# Patient Record
Sex: Female | Born: 1955 | Race: White | Hispanic: No | Marital: Married | Smoking: Never smoker
Health system: Southern US, Community
[De-identification: ages and names within clinical notes are randomized; demographics above are authoritative.]

## PROBLEM LIST (undated history)

## (undated) DIAGNOSIS — F419 Anxiety disorder, unspecified: Secondary | ICD-10-CM

## (undated) DIAGNOSIS — K219 Gastro-esophageal reflux disease without esophagitis: Secondary | ICD-10-CM

## (undated) DIAGNOSIS — J189 Pneumonia, unspecified organism: Secondary | ICD-10-CM

## (undated) HISTORY — DX: Anxiety disorder, unspecified: F41.9

## (undated) HISTORY — DX: Pneumonia, unspecified organism: J18.9

## (undated) HISTORY — DX: Gastro-esophageal reflux disease without esophagitis: K21.9

---

## 1989-11-22 HISTORY — PX: BREAST BIOPSY: SHX20

## 1999-09-23 ENCOUNTER — Other Ambulatory Visit: Admission: RE | Admit: 1999-09-23 | Discharge: 1999-09-23 | Payer: Self-pay | Admitting: Urology

## 2001-08-02 ENCOUNTER — Other Ambulatory Visit: Admission: RE | Admit: 2001-08-02 | Discharge: 2001-08-02 | Payer: Self-pay | Admitting: Obstetrics & Gynecology

## 2003-01-01 ENCOUNTER — Encounter: Payer: Self-pay | Admitting: Family Medicine

## 2003-01-01 ENCOUNTER — Encounter: Admission: RE | Admit: 2003-01-01 | Discharge: 2003-01-01 | Payer: Self-pay | Admitting: Family Medicine

## 2003-07-18 ENCOUNTER — Encounter: Payer: Self-pay | Admitting: Internal Medicine

## 2003-11-14 ENCOUNTER — Encounter: Admission: RE | Admit: 2003-11-14 | Discharge: 2003-11-14 | Payer: Self-pay | Admitting: Family Medicine

## 2004-01-31 ENCOUNTER — Other Ambulatory Visit: Admission: RE | Admit: 2004-01-31 | Discharge: 2004-01-31 | Payer: Self-pay | Admitting: Obstetrics & Gynecology

## 2004-12-08 ENCOUNTER — Encounter: Admission: RE | Admit: 2004-12-08 | Discharge: 2004-12-08 | Payer: Self-pay | Admitting: Family Medicine

## 2004-12-26 ENCOUNTER — Encounter: Admission: RE | Admit: 2004-12-26 | Discharge: 2004-12-26 | Payer: Self-pay | Admitting: Neurology

## 2005-02-08 ENCOUNTER — Other Ambulatory Visit: Admission: RE | Admit: 2005-02-08 | Discharge: 2005-02-08 | Payer: Self-pay | Admitting: Obstetrics & Gynecology

## 2005-02-16 ENCOUNTER — Ambulatory Visit (HOSPITAL_COMMUNITY): Admission: RE | Admit: 2005-02-16 | Discharge: 2005-02-17 | Payer: Self-pay | Admitting: Neurology

## 2005-04-30 ENCOUNTER — Encounter (INDEPENDENT_AMBULATORY_CARE_PROVIDER_SITE_OTHER): Payer: Self-pay | Admitting: *Deleted

## 2005-04-30 ENCOUNTER — Encounter: Admission: RE | Admit: 2005-04-30 | Discharge: 2005-04-30 | Payer: Self-pay | Admitting: Family Medicine

## 2005-08-03 ENCOUNTER — Ambulatory Visit: Payer: Self-pay | Admitting: Internal Medicine

## 2005-08-17 ENCOUNTER — Encounter: Payer: Self-pay | Admitting: Internal Medicine

## 2005-08-17 ENCOUNTER — Ambulatory Visit (HOSPITAL_COMMUNITY): Admission: RE | Admit: 2005-08-17 | Discharge: 2005-08-17 | Payer: Self-pay | Admitting: Internal Medicine

## 2006-01-23 ENCOUNTER — Encounter: Admission: RE | Admit: 2006-01-23 | Discharge: 2006-01-23 | Payer: Self-pay | Admitting: Family Medicine

## 2006-01-28 ENCOUNTER — Encounter: Admission: RE | Admit: 2006-01-28 | Discharge: 2006-01-28 | Payer: Self-pay | Admitting: Family Medicine

## 2009-02-10 ENCOUNTER — Encounter: Admission: RE | Admit: 2009-02-10 | Discharge: 2009-02-10 | Payer: Self-pay | Admitting: Family Medicine

## 2009-02-19 ENCOUNTER — Encounter: Admission: RE | Admit: 2009-02-19 | Discharge: 2009-02-19 | Payer: Self-pay

## 2009-02-28 ENCOUNTER — Ambulatory Visit: Payer: Self-pay | Admitting: Cardiology

## 2009-02-28 ENCOUNTER — Encounter: Payer: Self-pay | Admitting: Cardiology

## 2009-02-28 DIAGNOSIS — R002 Palpitations: Secondary | ICD-10-CM | POA: Insufficient documentation

## 2009-02-28 DIAGNOSIS — R079 Chest pain, unspecified: Secondary | ICD-10-CM

## 2009-03-04 ENCOUNTER — Ambulatory Visit: Payer: Self-pay | Admitting: Cardiology

## 2009-03-05 ENCOUNTER — Ambulatory Visit: Payer: Self-pay

## 2009-03-05 ENCOUNTER — Encounter: Payer: Self-pay | Admitting: Cardiology

## 2009-03-07 ENCOUNTER — Telehealth: Payer: Self-pay | Admitting: Cardiology

## 2009-03-11 ENCOUNTER — Telehealth: Payer: Self-pay | Admitting: Cardiology

## 2009-03-13 ENCOUNTER — Encounter: Payer: Self-pay | Admitting: Internal Medicine

## 2009-03-14 ENCOUNTER — Telehealth: Payer: Self-pay | Admitting: Cardiology

## 2009-03-18 ENCOUNTER — Telehealth: Payer: Self-pay | Admitting: Cardiology

## 2009-04-28 ENCOUNTER — Encounter: Payer: Self-pay | Admitting: Internal Medicine

## 2009-04-30 ENCOUNTER — Encounter: Payer: Self-pay | Admitting: Internal Medicine

## 2009-05-05 ENCOUNTER — Telehealth: Payer: Self-pay | Admitting: Internal Medicine

## 2009-05-06 DIAGNOSIS — R1319 Other dysphagia: Secondary | ICD-10-CM

## 2009-05-06 DIAGNOSIS — K219 Gastro-esophageal reflux disease without esophagitis: Secondary | ICD-10-CM

## 2009-05-09 ENCOUNTER — Ambulatory Visit: Payer: Self-pay | Admitting: Internal Medicine

## 2009-05-09 DIAGNOSIS — R030 Elevated blood-pressure reading, without diagnosis of hypertension: Secondary | ICD-10-CM

## 2009-05-09 DIAGNOSIS — F411 Generalized anxiety disorder: Secondary | ICD-10-CM | POA: Insufficient documentation

## 2009-05-09 DIAGNOSIS — E559 Vitamin D deficiency, unspecified: Secondary | ICD-10-CM | POA: Insufficient documentation

## 2009-05-12 ENCOUNTER — Telehealth: Payer: Self-pay | Admitting: Internal Medicine

## 2009-05-12 DIAGNOSIS — Z8719 Personal history of other diseases of the digestive system: Secondary | ICD-10-CM

## 2009-05-12 DIAGNOSIS — G43909 Migraine, unspecified, not intractable, without status migrainosus: Secondary | ICD-10-CM | POA: Insufficient documentation

## 2009-05-13 ENCOUNTER — Ambulatory Visit: Payer: Self-pay | Admitting: Internal Medicine

## 2009-05-13 ENCOUNTER — Telehealth: Payer: Self-pay | Admitting: Cardiology

## 2009-05-14 ENCOUNTER — Telehealth: Payer: Self-pay | Admitting: Internal Medicine

## 2009-05-15 ENCOUNTER — Telehealth: Payer: Self-pay | Admitting: Internal Medicine

## 2009-05-16 ENCOUNTER — Ambulatory Visit: Payer: Self-pay | Admitting: Internal Medicine

## 2009-05-16 ENCOUNTER — Telehealth: Payer: Self-pay | Admitting: Internal Medicine

## 2009-06-02 ENCOUNTER — Telehealth: Payer: Self-pay | Admitting: Internal Medicine

## 2009-06-10 ENCOUNTER — Ambulatory Visit: Payer: Self-pay | Admitting: Internal Medicine

## 2009-07-03 ENCOUNTER — Telehealth (INDEPENDENT_AMBULATORY_CARE_PROVIDER_SITE_OTHER): Payer: Self-pay | Admitting: *Deleted

## 2009-07-03 ENCOUNTER — Ambulatory Visit: Payer: Self-pay | Admitting: Internal Medicine

## 2009-07-07 ENCOUNTER — Telehealth (INDEPENDENT_AMBULATORY_CARE_PROVIDER_SITE_OTHER): Payer: Self-pay | Admitting: *Deleted

## 2009-07-07 ENCOUNTER — Telehealth: Payer: Self-pay | Admitting: Cardiology

## 2009-07-16 ENCOUNTER — Telehealth: Payer: Self-pay | Admitting: Internal Medicine

## 2010-02-23 ENCOUNTER — Telehealth: Payer: Self-pay | Admitting: Internal Medicine

## 2010-04-06 ENCOUNTER — Ambulatory Visit: Payer: Self-pay | Admitting: Internal Medicine

## 2010-12-13 ENCOUNTER — Encounter: Payer: Self-pay | Admitting: Internal Medicine

## 2010-12-13 ENCOUNTER — Encounter: Payer: Self-pay | Admitting: Family Medicine

## 2010-12-22 NOTE — Assessment & Plan Note (Signed)
Summary: STINGING/BURNING IN ESOPHAGUS             Veronica Sutton   History of Present Illness Visit Type: Follow-up Visit Primary GI MD: Veronica Sar MD Primary Provider: na Requesting Provider: n/a Chief Complaint: Pt was having burning and stinging in esphagus but she started using carafate and that has helped with the symptoms   History of Present Illness:   This is a 54 year old white female substernal and epigastric abdominal burning, history of anxiety and irritable bowel syndrome. An upper endoscopy and colonoscopy in July 2010 was a normal exam. In 2004, her upper endoscopy was also  normal and her CLOtest was negative. She has had a normal gastric emptying scan in 2006 and normal abdominal ultrasound in June 2006. There is a family history of colon cancer in a parent. She was taking Prevacid 30 mg daily for gastroesophageal reflux but was concerned about  the possibility of osteoporosis. When she discontinued Prevacid, she developed a severe rebound of the acid which lasted  several days. She is currently on Ranitidine 75 mg once or twice a day. Her burning is substernal and to the left of the sternum and it occurs when she sits at the computer, usually after meals. It has been completely relieved by Carafate slurry 10 cc twice a day.   GI Review of Systems      Denies abdominal pain, acid reflux, belching, bloating, chest pain, dysphagia with liquids, dysphagia with solids, heartburn, loss of appetite, nausea, vomiting, vomiting blood, weight loss, and  weight gain.        Denies anal fissure, black tarry stools, change in bowel habit, constipation, diarrhea, diverticulosis, fecal incontinence, heme positive stool, hemorrhoids, irritable bowel syndrome, jaundice, light color stool, liver problems, rectal bleeding, and  rectal pain.    Current Medications (verified): 1)  Calcium/magnesium/vit D3-Liquid .... Two Times A Day 2)  Carafate 1 Gm/69ml  Susp (Sucralfate) .... Take 2 Tsp. By Mouth 4  Times Daily For 48 Hours, Then 2 Times Daily.  Take 1 Hour Before or 1 Hour After Other Meds and Foods. 3)  Fish Oil   Oil (Fish Oil) .... One Tablet By Mouth Once Daily 4)  Vitamin E 600 Unit  Caps (Vitamin E) .... One Capsule By Mouth Once Daily 5)  Vitamin D 1000 Unit  Tabs (Cholecalciferol) .... One Tablet By Mouth Once Daily 6)  Zantac 75 75 Mg Tabs (Ranitidine Hcl) .... One Tablet By Mouth Two Times A Day As Needed  Allergies (verified): 1)  ! Polysporin (Bacitracin-Polymyxin B)  Past History:  Past Medical History: Reviewed history from 05/12/2009 and no changes required. GERD with esophagitis 2004, S/P dilation Vit D Defficiency 2010 (values :18 initially , high of 36 after treatment), Dr Jennette Kettle   ESOPHAGITIS, HX OF (ICD-V12.79) Family Hx of COLON CANCER (ICD-153.9) MIGRAINE HEADACHE (ICD-346.90) ANXIETY STATE, UNSPECIFIED (ICD-300.00) VITAMIN D DEFICIENCY (ICD-268.9) ELEVATED BLOOD PRESSURE WITHOUT DIAGNOSIS OF HYPERTENSION (ICD-796.2) GERD (ICD-530.81) DYSPHAGIA (ZOX-096.04) PALPITATIONS (ICD-785.1) CHEST PAIN (ICD-786.50)    Past Surgical History: Reviewed history from 05/12/2009 and no changes required. Toenail Bed biopsy-x2 Breast Bx-Right : negative 1991;  Endo 2004 with dilation  Family History: Reviewed history from 07/03/2009 and no changes required. Brother with possible silent MI in his 69s;  F Idiopathic Pulmonary Fibrosis (she believes diagnosis was delayed) No other early heart disease. Family History of Colon Cancer: Mother? Family History of Colon Polyps: Father Family History of Heart Disease: PGF, Brother? Family History of Diabetes: MGF. M had ?  schizophrenia; bro depression; F anxiety  Social History: Reviewed history from 02/28/2009 and no changes required. Married  Tobacco Use - No.  Alcohol Use - no Regular Exercise - no Children - 2  Review of Systems  The patient denies allergy/sinus, anemia, anxiety-new, arthritis/joint pain, back  pain, blood in urine, breast changes/lumps, change in vision, confusion, cough, coughing up blood, depression-new, fainting, fatigue, fever, headaches-new, hearing problems, heart murmur, heart rhythm changes, itching, menstrual pain, muscle pains/cramps, night sweats, nosebleeds, pregnancy symptoms, shortness of breath, skin rash, sleeping problems, sore throat, swelling of feet/legs, swollen lymph glands, thirst - excessive , urination - excessive , urination changes/pain, urine leakage, vision changes, and voice change.         Pertinent positive and negative review of systems were noted in the above HPI. All other ROS was otherwise negative.   Vital Signs:  Patient profile:   55 year old female Height:      64.25 inches Weight:      104 pounds BMI:     17.78 BSA:     1.49 Pulse rate:   64 / minute Pulse rhythm:   regular BP sitting:   126 / 78  (left arm) Cuff size:   regular  Vitals Entered By: Ok Anis CMA (Apr 06, 2010 10:23 AM)   Impression & Recommendations:  Problem # 1:  ESOPHAGITIS, HX OF (ICD-V12.79) Patient has gastroesophageal reflux controlled on PPIs but patient prefers not to take PPIs due to the possibility of osteoporosis. She needs to increase her ranitidine to 150 mg once or twice a day to control the reflux. She is hoping to eventually be able to stop the acid reducers altogether. We discussed Carafate slurry 10 cc p.o. b.i.d. She may increase it to q.i.d. p.r.n.  Problem # 2:  Family Hx of COLON CANCER (ICD-153.9) Patient is due to date on her colonoscopy. She was last examined in July 2010. Her next exam will be due in July 2015.  Problem # 3:  ANXIETY STATE, UNSPECIFIED (ICD-300.00) chronic intermittent anxiety. Taking Xanax 0.25 mg p.r.n.  Patient Instructions: 1)  Carafate slurry 10 cc p.o. b.i.d. or p.r.n. 2)  Zantac 150 mg p.o. b.i.d. x2 weeks then q.d. 3)  Antireflux measures. 4)  Recall colonoscopy July 2015. 5)  Patient would like to get a new  primary care physician. We will have her contact Dr.MaryJohn Baxley. 6)  Copy sent to : Dr Foye Deer Baxley 7)  The medication list was reviewed and reconciled.  All changed / newly prescribed medications were explained.  A complete medication list was provided to the patient / caregiver. Prescriptions: ZANTAC 75 75 MG TABS (RANITIDINE HCL) one tablet by mouth two times a day as needed  #60 x 3   Entered by:   Lamona Curl CMA (AAMA)   Authorized by:   Hart Carwin MD   Signed by:   Lamona Curl CMA (AAMA) on 04/06/2010   Method used:   Electronically to        [facility]* (retail)       [address]       [place], [place]  [id number]       Ph: [phone]       Fax: [phone]   RxID:   [id number] CARAFATE 1 GM/10ML  SUSP (SUCRALFATE) Take 2 tsp. by mouth 4 times daily for 48 hours, then 2 times daily.  Take 1 hour before or 1 hour after other meds and foods.  #12  ounces x 1   Entered by:   Lamona Curl CMA (AAMA)   Authorized by:   Hart Carwin MD   Signed by:   Lamona Curl CMA (AAMA) on 04/06/2010   Method used:   Electronically to        [facility]* (retail)       [address]       [place], [place]  [id number]       Ph: [phone]       Fax: [phone]   RxID:   [id number]

## 2010-12-22 NOTE — Progress Notes (Signed)
Summary: Triage  Phone Note Call from Patient Call back at Home Phone [phone]   Caller: Patient Call For: Dr. Juanda Chance Reason for Call: Talk to Nurse Summary of Call: problems w/her esophagus. Feels like she may have a ulcer Initial call taken by: Karna Christmas,  February 23, 2010 9:08 AM  Follow-up for Phone Call        Pt. c/o stinging/burning sensation in her esophagus, has episodes of this sensation. Uses Prevacid and Zantac PRN, but thinks Prevacid is causing her reflux symptoms to get worse. Denies n/v, bloating, constipation, diarrhea, blood, black stools, fever. Declines an appt. with an extender, only wants to see Dr.Anamika Kueker.  1) See Dr.Darianna Amy on 03-18-10 at 9am 2) Soft,bland diet. No spicy,greasy,fried foods. Advance diet as tolerated. 3) Prilosec OTC or Zegerid OTC daily until OV--BUT,Pt. declines to use meds. 4) Pt. instructed to call back as needed.  *DR.Marisha Renier--Pt.requests you call her today, she insists she speak with you ASAP. [phone].   Follow-up by: Laureen Ochs LPN,  February 23, 2010 9:29 AM  Additional Follow-up for Phone Call Additional follow up Details #1::        I have spoken to the pt., please send Carafate slurry ,#12 oz, 2 tsp by mouth qid x 48 hrs then two times a day.[employer], 1 refill,  She will call back with an update in 2 weeks. please also , find a result of gastric biopsy /CLO test form 2004 EGD ( not scanned in EMR)Thanx Additional Follow-up by: Hart Carwin MD,  February 23, 2010 1:12 PM    Additional Follow-up for Phone Call Additional follow up Details #2::    Script to pt. pharmacy. I have requested pt. chart to get the BX. reports. Follow-up by: Laureen Ochs LPN,  February 23, 2010 2:41 PM  Additional Follow-up for Phone Call Additional follow up Details #3:: Details for Additional Follow-up Action Taken: DR.Monaye Blackie--The pt. chart and biopsy reports are on your desk for review. (reports have been sent to be scanned into  EMR) Laureen Ochs LPN  February 24, 4781 11:55 AM 2004 EGD CLO was negative. Quanisha Drewry  Additional Follow-up by: Hart Carwin MD,  February 24, 2010 10:36 PM  New/Updated Medications: CARAFATE 1 GM/10ML  SUSP (SUCRALFATE) Take 2 tsp. by mouth 4 times daily for 48 hours, then 2 times daily.  Take 1 hour before or 1 hour after other meds and foods. Prescriptions: CARAFATE 1 GM/10ML  SUSP (SUCRALFATE) Take 2 tsp. by mouth 4 times daily for 48 hours, then 2 times daily.  Take 1 hour before or 1 hour after other meds and foods.  #12 oz. x 1   Entered by:   Laureen Ochs LPN   Authorized by:   Hart Carwin MD   Signed by:   Laureen Ochs LPN on 95/62/1308   Method used:   Electronically to        [facility]* (retail)       [address]       [place], [place]  [id number]       Ph: [phone]       Fax: [phone]   RxID:   [id number]

## 2011-04-09 NOTE — Op Note (Signed)
Veronica Sutton, Veronica Sutton                ACCOUNT NO.:  [id number]   MEDICAL RECORD NO.:  [id number]          PATIENT TYPE:  OUT   LOCATION:  [facility]                          FACILITY:  [facility]   PHYSICIAN:  Genene Churn. Love, M.D.    DATE OF BIRTH:  18-Mar-1956   DATE OF PROCEDURE:  02/16/2005  DATE OF DISCHARGE:                                 OPERATIVE REPORT   DESCRIPTION OF PROCEDURE:  The patient was prepped and draped in the left  lateral decubitus position using Betadine and 1% Xylocaine.  The L3-4  interspace was entered without difficulty.  Opening pressure was slightly  high at 200 mmH2O, but the bed was not flat.  Clear, colorless CSF was  obtained and sent for protein, glucose, VDRL, cell count, and differential,  Lyme titer, IgG, oligoclonal IgG, and angiotensin converting enzyme, and  anti-DNA.  The patient tolerated the procedure well.  Tube was to be held.      JML/MEDQ  D:  02/16/2005  T:  02/16/2005  Job:  [id number]

## 2011-07-05 ENCOUNTER — Ambulatory Visit (INDEPENDENT_AMBULATORY_CARE_PROVIDER_SITE_OTHER): Payer: BC Managed Care – PPO | Admitting: Internal Medicine

## 2011-07-05 ENCOUNTER — Encounter: Payer: Self-pay | Admitting: Internal Medicine

## 2011-07-05 VITALS — BP 110/74 | HR 76 | Ht 64.0 in | Wt 113.0 lb

## 2011-07-05 DIAGNOSIS — K224 Dyskinesia of esophagus: Secondary | ICD-10-CM

## 2011-07-05 DIAGNOSIS — R079 Chest pain, unspecified: Secondary | ICD-10-CM

## 2011-07-05 DIAGNOSIS — K219 Gastro-esophageal reflux disease without esophagitis: Secondary | ICD-10-CM

## 2011-07-05 DIAGNOSIS — K589 Irritable bowel syndrome without diarrhea: Secondary | ICD-10-CM

## 2011-07-05 NOTE — Patient Instructions (Addendum)
You have been scheduled for a Barium Esophogram and Upper GI at [facility] (1st floor of the hospital) on 07/08/11 at 10:30 am. Please arrive 15 minutes prior to your appointment for registration. Make certain not to have anything to eat or drink 4 hours prior to your test. If you need to reschedule for any reason, please contact radiology at [phone] to do so.

## 2011-07-05 NOTE — Progress Notes (Signed)
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 She has had at least 2 upper endoscopies; the last one in the June 2010 which were essentially normal. The gastric emptying scan in 2006 was normal. She has postprandial chest pain which occurs 30-45 minutes after meals while sitting at the table. She may prevent the pain by standing up and walking after the meal. The pain and burning is associated only with meals. It does not occur when she exercises or when she is in a supine position at night. There was no pain when she was on Prevacid 30 mg a day but she discontinued it because of concern for osteoporosis. She was subsequently taking Zantac 150 mg a day but also discontinued that. She is currently not taking any acid suppressing agents.   Past Medical History  Diagnosis Date  . GERD (gastroesophageal reflux disease)   . Esophagitis   . Vitamin D deficiency   . Migraine   . Anxiety    Past Surgical History  Procedure Date  . Breast biopsy 1991    right    reports that she has never smoked. She has never used smokeless tobacco. She reports that she does not drink alcohol or use illicit drugs. family history includes Anxiety disorder in her father; Colon cancer in her mother; Colon polyps in her father; Depression in her brother; Diabetes in her maternal grandfather; Heart attack in her brother; Heart disease in her paternal grandfather; Pulmonary fibrosis in her father; and Schizophrenia in her mother. Allergies  Allergen Reactions  . Bacitracin-Polymyxin B     Rash after using neosporin        Review of Systems: Denies any lower GI symptoms of abdominal pain or constipation.  The remainder of the 10  point ROS is negative except as outlined in H&P   Physical Exam: General appearance  Well  developed in no distress. Eyes- non icteric. HEENT nontraumatic, normocephalic. Mouth no lesions, tongue papillated, no cheilosis. Neck supple without adenopathy, thyroid not enlarged, no carotid bruits, no JVD. Lungs Clear to auscultation bilaterally. Cor normal S1 normal S2, regular rhythm , no murmur,  quiet precordium. Abdomen nontender. Rectal: Not done. Extremities no pedal edema. Skin no lesions. Neurological alert and oriented x 3. Psychological normal mood and affect.  Assessment and Plan:  Problem #2 Gastroesophageal reflux versus esophageal spasm. Her symptoms recurred after she discontinued acid suppressing agents. We will proceed with a barium esophagram and upper GI series to assess for motility, reflux and her gastric emptying. She will likely have to start back on acid suppressing agents. She wants to wait on the results of the upper GI series before starting this due to concern about osteoporosis.   Problem #1 Family history of colon cancer. Her last colonoscopy was in 2010 and her next colonoscopy will be due in 2015.  07/05/2011 Lina Sar

## 2011-07-06 ENCOUNTER — Encounter: Payer: Self-pay | Admitting: Internal Medicine

## 2011-07-08 ENCOUNTER — Ambulatory Visit (HOSPITAL_COMMUNITY)
Admission: RE | Admit: 2011-07-08 | Discharge: 2011-07-08 | Disposition: A | Discharge status: Home / Self Care | Payer: BC Managed Care – PPO | Source: Ambulatory Visit | Attending: Internal Medicine | Admitting: Internal Medicine

## 2011-07-08 DIAGNOSIS — R079 Chest pain, unspecified: Secondary | ICD-10-CM | POA: Insufficient documentation

## 2011-07-08 DIAGNOSIS — K224 Dyskinesia of esophagus: Secondary | ICD-10-CM

## 2011-07-09 ENCOUNTER — Telehealth: Payer: Self-pay | Admitting: *Deleted

## 2011-07-09 NOTE — Telephone Encounter (Signed)
Patient given results and recommendations. Patient states she had a hard time with side effects of Prevacid(bone density loss, wt. Loss) and she thought Dr. Juanda Chance talked about her going on Zantac after the procedure. Patient wants to clarify which she should do. Please, advise.

## 2011-07-09 NOTE — Telephone Encounter (Signed)
Message copied by Daphine Deutscher on Fri Jul 09, 2011  9:15 AM ------      Message from: Hart Carwin      Created: Thu Jul 08, 2011  9:38 PM       Please call pt with normal Ba esophagram: normal esophageal peristalsis, no spasm or abnormal contractions. They have not noticed any g-e reflux. Tablet passed without delay. I suggest that she gets back on Prevacid 30 mg/day x 1 month and calls me back after 4 weeks.

## 2011-07-09 NOTE — Telephone Encounter (Signed)
She ought to be on a PPI because it is stronger than H2RA ( Zantac), and we need to know if her Sx' s are related to reflux. Zantac is not strong enough and we may be left in doubt at the end of the month as to what is causing her chest pain.

## 2011-07-09 NOTE — Telephone Encounter (Signed)
Patient given Dr. Brodie's recommendations. 

## 2013-07-27 ENCOUNTER — Other Ambulatory Visit: Payer: Self-pay | Admitting: Obstetrics & Gynecology

## 2014-02-13 ENCOUNTER — Encounter: Payer: Self-pay | Admitting: Internal Medicine

## 2014-03-27 ENCOUNTER — Emergency Department (HOSPITAL_COMMUNITY)
Admission: EM | Admit: 2014-03-27 | Discharge: 2014-03-27 | Disposition: A | Discharge status: Home / Self Care | Payer: BC Managed Care – PPO | Source: Home / Self Care | Attending: Emergency Medicine | Admitting: Emergency Medicine

## 2014-03-27 ENCOUNTER — Encounter (HOSPITAL_COMMUNITY): Payer: Self-pay | Admitting: Emergency Medicine

## 2014-03-27 DIAGNOSIS — W57XXXA Bitten or stung by nonvenomous insect and other nonvenomous arthropods, initial encounter: Secondary | ICD-10-CM

## 2014-03-27 DIAGNOSIS — T148 Other injury of unspecified body region: Secondary | ICD-10-CM

## 2014-03-27 MED ORDER — DOXYCYCLINE HYCLATE 100 MG PO TABS: 100.0000 mg | ORAL_TABLET | Freq: Two times a day (BID) | ORAL | Status: DC

## 2014-03-27 NOTE — ED Provider Notes (Signed)
  Chief Complaint    Chief Complaint  Patient presents with  . Tick Removal    History of Present Illness      Veronica Sutton is a 58 year old female who discovered a tick attached to her left, lateral ankle last night. She was afraid to remove it herself. It's not been painful. There is no surrounding erythema she denies any systemic symptoms such as fever, chills, headache, muscle aches, joint aches, or generalized rash.  Review of Systems   Other than as noted above, the patient denies any of the following symptoms: Systemic:  No fever or chills. ENT:  No nasal congestion, rhinorrhea, sore throat, swelling of lips, tongue or throat. Resp:  No cough, wheezing, or shortness of breath.  [place]    Past medical history, family history, social history, meds, and allergies were reviewed.   Physical Exam     Vital signs:  BP 136/64  Pulse 60  Temp(Src) 97.9 F (36.6 C) (Oral)  Resp 14  SpO2 100% Gen:  Alert, oriented, in no distress. ENT:  Pharynx clear, no intraoral lesions, moist mucous membranes. Lungs:  Clear to auscultation. Skin:  Skin was clear and free of any rash. There is a tiny tick attached to the lateral aspect of the left ankle.  Procedure Note:  Verbal informed consent was obtained from the patient.  Risks and benefits were outlined with the patient.  Patient understands and accepts these risks. A time out was called and the procedure and identity of the patient were confirmed verbally.    The procedure was then performed as follows:  The tick was prepped with Betadine and alcohol, it was then grasped with a forceps and removed with steady pressure. There were not any apparent mouthparts left behind.  The patient tolerated the procedure well without any immediate complications.   Assessment    The encounter diagnosis was Tick bite.  No evidence of [place] spotted fever Lyme disease at this point. The patient was provided with a prescription for doxycycline.  She'll hold onto it for right now. If she has any localized redness or swelling, she go ahead and start the doxycycline. If she develops any systemic symptoms such as fever, chills, headache, muscle aches, joint pain, or skin rash, she is to come back here immediately.  Plan     1.  Meds:  The following meds were prescribed:   Discharge Medication List as of 03/27/2014  2:06 PM    START taking these medications   Details  doxycycline (VIBRA-TABS) 100 MG tablet Take 1 tablet (100 mg total) by mouth 2 (two) times daily., Starting 03/27/2014, Until Discontinued, Print        2.  Patient Education/Counseling:  The patient was given appropriate handouts, self care instructions, and instructed in symptomatic relief.  Suggested she wash the area with soap and water and apply antibiotic for the next 3 days.  3.  Follow up:  The patient was told to follow up here if no better in 3 to 4 days, or sooner if becoming worse in any way, and given some red flag symptoms such as worsening rash, fever, or difficulty breathing which would prompt immediate return.  Follow up here if necessary.      Harden Mo, MD 03/27/14 [phone]

## 2014-03-27 NOTE — ED Notes (Signed)
Found tick attached to left ankle this AM. NAD

## 2014-03-27 NOTE — Discharge Instructions (Signed)
Wash with soap and water and apply antibiotic ointment twice daily for next 3 days.  If local redness occurs, start Doxycycline.  If generalized symptoms of fever, chills, aches, headache or skin rash occur, come in for recheck.

## 2014-09-04 ENCOUNTER — Encounter: Payer: Self-pay | Admitting: Internal Medicine

## 2014-09-19 ENCOUNTER — Other Ambulatory Visit: Payer: Self-pay | Admitting: Obstetrics & Gynecology

## 2014-09-20 LAB — CYTOLOGY - PAP

## 2015-03-12 ENCOUNTER — Encounter: Payer: Self-pay | Admitting: Internal Medicine

## 2017-01-10 ENCOUNTER — Other Ambulatory Visit: Payer: Self-pay | Admitting: Obstetrics & Gynecology

## 2017-01-10 DIAGNOSIS — I8393 Asymptomatic varicose veins of bilateral lower extremities: Secondary | ICD-10-CM

## 2017-01-20 ENCOUNTER — Ambulatory Visit
Admission: RE | Admit: 2017-01-20 | Discharge: 2017-01-20 | Disposition: A | Discharge status: Home / Self Care | Payer: BLUE CROSS/BLUE SHIELD | Source: Ambulatory Visit | Attending: Obstetrics & Gynecology | Admitting: Obstetrics & Gynecology

## 2017-01-20 DIAGNOSIS — I8393 Asymptomatic varicose veins of bilateral lower extremities: Secondary | ICD-10-CM

## 2017-01-20 NOTE — Consult Note (Signed)
Chief Complaint:  Lower extremity leg pain, fatigue, small varicose veins. Evaluate for venous insufficiency.   Referring Physician(s): Neal,W Jori Moll  History of Present Illness: Veronica Sutton is a 61 y.o. female with lower extremity leg pain and small varicose veins, concerning for venous insufficiency. Review of her venous history performed. Patient has had prior right medial knee region phlebectomy for varicose veins over a short segment. No significant treatments. No history of DVT or thrombophlebitis. No skin lesions or ulcerations. No fevers. Patient remains active and does run regularly during the week. No physical limitations. She currently does not take any pain medications.  Past Medical History:  Diagnosis Date  . Anxiety   . Esophagitis   . GERD (gastroesophageal reflux disease)   . Migraine   . Vitamin D deficiency     Past Surgical History:  Procedure Laterality Date  . BREAST BIOPSY  1991   right    Allergies: Bacitracin-polymyxin b  Medications: Prior to Admission medications   Medication Sig Start Date End Date Taking? Authorizing Provider  AMBULATORY NON FORMULARY MEDICATION Medication Name: Progesterone  1 tablet once daily x 12 days every 3 months    Yes Historical Provider, MD  Cholecalciferol (VITAMIN D) 1000 UNITS capsule Take 1,000 Units by mouth daily.     Yes Historical Provider, MD  estradiol (VIVELLE-DOT) 0.05 MG/24HR Place 1 patch onto the skin 2 (two) times a week.     Yes Historical Provider, MD  doxycycline (VIBRA-TABS) 100 MG tablet Take 1 tablet (100 mg total) by mouth 2 (two) times daily. Patient not taking: Reported on 01/20/2017 03/27/14   Harden Mo, MD     Family History  Problem Relation Age of Onset  . Heart attack Brother     ??? silent  . Pulmonary fibrosis Father     idiopathic  . Colon cancer Mother     ????  . Colon polyps Father   . Heart disease Paternal Grandfather   . Diabetes Maternal Grandfather   .  Depression Brother   . Schizophrenia Mother     ?????  . Anxiety disorder Father     Social History   Social History  . Marital status: Married    Spouse name: N/A  . Number of children: N/A  . Years of education: N/A   Social History Main Topics  . Smoking status: Never Smoker  . Smokeless tobacco: Never Used  . Alcohol use No  . Drug use: No  . Sexual activity: Not on file   Other Topics Concern  . Not on file   Social History Narrative  . No narrative on file     Review of Systems: A 12 point ROS discussed and pertinent positives are indicated in the HPI above.  All other systems are negative.  Review of Systems  Vital Signs: BP 135/85 (BP Location: Left Arm, Patient Position: Sitting, Cuff Size: Normal)   Pulse 62   Temp 97.9 F (36.6 C) (Oral)   Physical Exam  Constitutional: She appears well-developed and well-nourished. No distress.  Musculoskeletal: Normal range of motion. She exhibits no edema, tenderness or deformity.  No significant peripheral edema. Skin intact. Minor scattered spider veins, most pronounced in the popliteal regions. Very small nontender compressible subsurface varicosities in the right medial calf region. No signs of superficial thrombosis, phlebitis, or cellulitis. Normal pedal pulses.  Skin: She is not diaphoretic.      Imaging: [place] Rad Eval And Mgmt  Result Date: 01/20/2017 Please refer to "Notes" to see consult details.   Labs:  CBC: No results for input(s): WBC, HGB, HCT, PLT in the last 8760 hours.  COAGS: No results for input(s): INR, APTT in the last 8760 hours.  BMP: No results for input(s): NA, K, CL, CO2, GLUCOSE, BUN, CALCIUM, CREATININE, GFRNONAA, GFRAA in the last 8760 hours.  Invalid input(s): CMP  LIVER FUNCTION TESTS: No results for input(s): BILITOT, AST, ALT, ALKPHOS, PROT, ALBUMIN in the last 8760 hours.   Assessment and Plan:  Minor right distal GSV venous insufficiency over a short segment in the  mid calf region with an adjacent prominent perforator vein. Small adjacent subsurface dilated reticular veins versus early varicose veins. No large varicosities. This is the only evidence of superficial saphenous venous insufficiency. Because of the minor venous insufficiency, I would recommend a conservative management plan.  Plan: Prescription strength graded compression stockings, Calf length. Continue daily exercise and running regimen. Consider compression stockings while running.  Follow-up as needed for any worsening symptoms or enlarging varicosities which may warrant treatment in the future. All questions were addressed. She has a clear understanding and agrees with this plan.  Thank you for this interesting consult.  I greatly enjoyed meeting Veronica Sutton and look forward to participating in their care.  A copy of this report was sent to the requesting provider on this date.  Electronically Signed: Greggory Keen 01/20/2017, 2:54 PM   I spent a total of  30 Minutes   in face to face in clinical consultation, greater than 50% of which was counseling/coordinating care for this patient with minor right lower extremity venous insufficiency and small early varicose veins.

## 2017-02-08 ENCOUNTER — Other Ambulatory Visit (HOSPITAL_COMMUNITY): Payer: Self-pay | Admitting: Interventional Radiology

## 2017-02-08 DIAGNOSIS — M79604 Pain in right leg: Secondary | ICD-10-CM

## 2017-02-08 DIAGNOSIS — I83813 Varicose veins of bilateral lower extremities with pain: Secondary | ICD-10-CM

## 2017-02-08 DIAGNOSIS — M79605 Pain in left leg: Principal | ICD-10-CM

## 2017-02-24 ENCOUNTER — Other Ambulatory Visit (HOSPITAL_COMMUNITY): Payer: Self-pay | Admitting: Interventional Radiology

## 2017-02-24 ENCOUNTER — Ambulatory Visit
Admission: RE | Admit: 2017-02-24 | Discharge: 2017-02-24 | Disposition: A | Discharge status: Home / Self Care | Payer: BLUE CROSS/BLUE SHIELD | Source: Ambulatory Visit | Attending: Interventional Radiology | Admitting: Interventional Radiology

## 2017-02-24 DIAGNOSIS — M79604 Pain in right leg: Secondary | ICD-10-CM

## 2017-02-24 DIAGNOSIS — I83813 Varicose veins of bilateral lower extremities with pain: Secondary | ICD-10-CM

## 2017-02-24 DIAGNOSIS — M79605 Pain in left leg: Principal | ICD-10-CM

## 2017-02-24 NOTE — Progress Notes (Signed)
Patient ID: Veronica Sutton, female   DOB: [birth year], 61 y.o.   MRN: [id number]       Chief Complaint:  Right lower extremity pain, small varicose veins, minor venous insufficiency, scattered spider veins   Referring Physician(s): Maisie Fus  History of Present Illness: Veronica Sutton is a 61 y.o. female with lower extremity leg pain and small varicose veins. She was evaluated for venous insufficiency 01/20/2017. This demonstrated minor distal right GSV venous insufficiency with early small calf branching varicosities. This has been treated conservatively with compression stockings, daily exercise, and elevation. She also has a history of prior localized varicose vein phlebectomy in the right medial knee over a short segment. No recent thrombophlebitis or cellulitis. No skin lesions or ulcerations. No interval fevers. She remains active and runs regularly. No physical limitations. She currently does not take any pain medicines. She returns today for sclerotherapy.  Past Medical History:  Diagnosis Date  . Anxiety   . Esophagitis   . GERD (gastroesophageal reflux disease)   . Migraine   . Vitamin D deficiency     Past Surgical History:  Procedure Laterality Date  . BREAST BIOPSY  1991   right    Allergies: Bacitracin-polymyxin b  Medications: Prior to Admission medications   Medication Sig Start Date End Date Taking? Authorizing Provider  AMBULATORY NON FORMULARY MEDICATION Medication Name: Progesterone  1 tablet once daily x 12 days every 3 months     Historical Provider, MD  Cholecalciferol (VITAMIN D) 1000 UNITS capsule Take 1,000 Units by mouth daily.      Historical Provider, MD  doxycycline (VIBRA-TABS) 100 MG tablet Take 1 tablet (100 mg total) by mouth 2 (two) times daily. Patient not taking: Reported on 01/20/2017 03/27/14   Harden Mo, MD  estradiol (VIVELLE-DOT) 0.05 MG/24HR Place 1 patch onto the skin 2 (two) times a week.      Historical Provider, MD      Family History  Problem Relation Age of Onset  . Heart attack Brother     ??? silent  . Pulmonary fibrosis Father     idiopathic  . Colon cancer Mother     ????  . Colon polyps Father   . Heart disease Paternal Grandfather   . Diabetes Maternal Grandfather   . Depression Brother   . Schizophrenia Mother     ?????  . Anxiety disorder Father     Social History   Social History  . Marital status: Married    Spouse name: N/A  . Number of children: N/A  . Years of education: N/A   Social History Main Topics  . Smoking status: Never Smoker  . Smokeless tobacco: Never Used  . Alcohol use No  . Drug use: No  . Sexual activity: Not on file   Other Topics Concern  . Not on file   Social History Narrative  . No narrative on file      Review of Systems: A 12 point ROS discussed and pertinent positives are indicated in the HPI above.  All other systems are negative.  Review of Systems  Vital Signs: There were no vitals taken for this visit.  Physical Exam  Constitutional: She appears well-developed and well-nourished. No distress.  Musculoskeletal: Normal range of motion. She exhibits no edema, tenderness or deformity.  Small right medial calf varicose veins again evident. Scattered spider veins in the lower extremity's, pronounced in the popliteal regions. Skin intact. No skin lesions, signs of phlebitis or  cellulitis. Normal pedal pulses.  Skin: She is not diaphoretic.     Imaging: [place] Injec Sclerotherapy Single  Result Date: 02/24/2017 INDICATION: Bilateral lower extremity spider telangiectasias and small right calf sub surface varicose veins EXAM: ULTRASOUND INJECTION OF SCLEROSANT; MULTIPLE INCOMPETENT VEINS (SAME LEG) MEDICATIONS: None. ANESTHESIA/SEDATION: None. COMPLICATIONS: None immediate. PROCEDURE: Informed written consent was obtained from the patient after a thorough discussion of the procedural risks, benefits and alternatives. All questions were  addressed. Maximal Sterile Barrier Technique was utilized including caps, mask, sterile gowns, sterile gloves, sterile drape, hand hygiene and skin antiseptic. A timeout was performed prior to the initiation of the procedure. Survey ultrasound of both lower extremities was performed and appropriate skin entry sites were marked in the right calf region and both popliteal fossas. The sites were then prepped with ChloraPrep and draped in standard fashion. Under direct ultrasound guidance, a foam of 1% Polidocanol was injected in small aliquots into the small right calf varicose veins until there was good filling of the affected regions. Next, the patient was repositioned prone. Multi stick injection sclerotherapy performed of popliteal fossa spider telangiectasias bilaterally with good dispersal throughout the abnormal vasculature. FINDINGS: Imaging confirms percutaneous needle access of the right calf sub surface varicosities for foam sclerotherapy. Successful sclerotherapy of spider veins in the popliteal fossa regions bilaterally. Total volume of Injections:  6 mL sclerosant. IMPRESSION: Technically successful foam sclerotherapy of small right calf varicose veins. Successful sclerotherapy of bilateral popliteal fossa spider veins. Electronically Signed   By: Jerilynn Mages.  Laurence Folz M.D.   On: 02/24/2017 15:01   [place] Rad Eval And Mgmt  Result Date: 02/24/2017 Please refer to "Notes" to see consult details.   Labs:  CBC: No results for input(s): WBC, HGB, HCT, PLT in the last 8760 hours.  COAGS: No results for input(s): INR, APTT in the last 8760 hours.  BMP: No results for input(s): NA, K, CL, CO2, GLUCOSE, BUN, CALCIUM, CREATININE, GFRNONAA, GFRAA in the last 8760 hours.  Invalid input(s): CMP  LIVER FUNCTION TESTS: No results for input(s): BILITOT, AST, ALT, ALKPHOS, PROT, ALBUMIN in the last 8760 hours.   Assessment and Plan:  Plan for foam sclerotherapy of the small right medial calf  varicosities.  Injection sclerotherapy will be performed of the most pronounced spider veins in the popliteal regions bilaterally.  Outpatient follow-up in one month.  Electronically Signed: Greggory Keen 02/24/2017, 4:45 PM   I spent a total of    15 Minutes in face to face in clinical consultation, greater than 50% of which was counseling/coordinating care for this patient with mild venous insufficiency, varicose and spider veins.

## 2017-02-26 ENCOUNTER — Ambulatory Visit (HOSPITAL_COMMUNITY)
Admission: EM | Admit: 2017-02-26 | Discharge: 2017-02-26 | Disposition: A | Discharge status: Home / Self Care | Payer: BLUE CROSS/BLUE SHIELD | Attending: Internal Medicine | Admitting: Internal Medicine

## 2017-02-26 ENCOUNTER — Encounter (HOSPITAL_COMMUNITY): Payer: Self-pay | Admitting: Emergency Medicine

## 2017-02-26 DIAGNOSIS — J069 Acute upper respiratory infection, unspecified: Secondary | ICD-10-CM

## 2017-02-26 DIAGNOSIS — B9789 Other viral agents as the cause of diseases classified elsewhere: Secondary | ICD-10-CM | POA: Diagnosis not present

## 2017-02-26 MED ORDER — BENZONATATE 100 MG PO CAPS: 100.0000 mg | ORAL_CAPSULE | Freq: Three times a day (TID) | ORAL | 0 refills | Status: DC

## 2017-02-26 NOTE — ED Provider Notes (Signed)
CSN: [id number]     Arrival date & time 02/26/17  1327 History   First MD Initiated Contact with Patient 02/26/17 1500     Chief Complaint  Patient presents with  . Cough   (Consider location/radiation/quality/duration/timing/severity/associated sxs/prior Treatment) 61 year old female presents to clinic for evaluation of runny nose, congestion, and cough onset 24 hours ago.   The history is provided by the patient.  Cough  Cough characteristics:  Non-productive, dry and hacking Sputum characteristics:  Clear Severity:  Mild Onset quality:  Gradual Duration:  1 day Timing:  Constant Progression:  Worsening Chronicity:  New Smoker: no   Context: weather changes   Relieved by:  None tried Worsened by:  Nothing Ineffective treatments:  None tried Associated symptoms: rhinorrhea and sinus congestion   Associated symptoms: no chest pain, no chills, no ear fullness, no ear pain, no eye discharge, no fever, no myalgias, no shortness of breath, no sore throat and no wheezing     Past Medical History:  Diagnosis Date  . Anxiety   . Esophagitis   . GERD (gastroesophageal reflux disease)   . Migraine   . Vitamin D deficiency    Past Surgical History:  Procedure Laterality Date  . BREAST BIOPSY  1991   right   Family History  Problem Relation Age of Onset  . Heart attack Brother     ??? silent  . Pulmonary fibrosis Father     idiopathic  . Colon polyps Father   . Anxiety disorder Father   . Colon cancer Mother     ????  . Schizophrenia Mother     ?????  . Heart disease Paternal Grandfather   . Diabetes Maternal Grandfather   . Depression Brother    Social History  Substance Use Topics  . Smoking status: Never Smoker  . Smokeless tobacco: Never Used  . Alcohol use No   OB History    No data available     Review of Systems  Constitutional: Negative for chills and fever.  HENT: Positive for congestion and rhinorrhea. Negative for ear pain, sinus pain, sinus  pressure and sore throat.   Eyes: Negative for discharge and redness.  Respiratory: Positive for cough. Negative for shortness of breath and wheezing.   Cardiovascular: Negative for chest pain and palpitations.  Gastrointestinal: Negative for diarrhea, nausea and vomiting.  Genitourinary: Negative for frequency and urgency.  Musculoskeletal: Negative for myalgias, neck pain and neck stiffness.  Neurological: Negative for dizziness and weakness.    Allergies  Bacitracin-polymyxin b  Home Medications   Prior to Admission medications   Medication Sig Start Date End Date Taking? Authorizing Provider  AMBULATORY NON FORMULARY MEDICATION Medication Name: Progesterone  1 tablet once daily x 12 days every 3 months     Historical Provider, MD  benzonatate (TESSALON) 100 MG capsule Take 1 capsule (100 mg total) by mouth every 8 (eight) hours. 02/26/17   Barnet Glasgow, NP  Cholecalciferol (VITAMIN D) 1000 UNITS capsule Take 1,000 Units by mouth daily.      Historical Provider, MD  doxycycline (VIBRA-TABS) 100 MG tablet Take 1 tablet (100 mg total) by mouth 2 (two) times daily. Patient not taking: Reported on 01/20/2017 03/27/14   Harden Mo, MD  estradiol (VIVELLE-DOT) 0.05 MG/24HR Place 1 patch onto the skin 2 (two) times a week.      Historical Provider, MD   Meds Ordered and Administered this Visit  Medications - No data to display  BP 117/82 (BP Location: Left  Arm)   Pulse 80   Temp 100.2 F (37.9 C) (Oral)   Resp 18   SpO2 100%  No data found.   Physical Exam  Constitutional: She is oriented to person, place, and time. She appears well-developed and well-nourished. No distress.  HENT:  Head: Normocephalic and atraumatic.  Right Ear: External ear normal.  Left Ear: External ear normal.  Eyes: Conjunctivae are normal. Right eye exhibits no discharge. Left eye exhibits no discharge.  Cardiovascular: Normal rate and regular rhythm.   Pulmonary/Chest: Effort normal and breath  sounds normal.  Abdominal: Soft. Bowel sounds are normal.  Lymphadenopathy:       Head (right side): No submental, no submandibular, no tonsillar and no preauricular adenopathy present.       Head (left side): No submental, no submandibular, no tonsillar and no preauricular adenopathy present.    She has no cervical adenopathy.  Neurological: She is alert and oriented to person, place, and time.  Skin: Skin is warm and dry. Capillary refill takes less than 2 seconds. She is not diaphoretic.  Psychiatric: She has a normal mood and affect. Her behavior is normal.  Nursing note and vitals reviewed.   Urgent Care Course     Procedures (including critical care time)  Labs Review Labs Reviewed - No data to display  Imaging Review No results found.     MDM   1. Viral URI with cough     Treating for viral URI with cough, and prescription for Tessalon, provided counseling on over-the-counter therapies for symptom management, encouraged following up with primary care if symptoms persist past one week     Barnet Glasgow, NP 02/26/17 2241

## 2017-02-26 NOTE — Discharge Instructions (Signed)
You most likely have a viral URI, I advise rest, plenty of fluids and management of symptoms with over the counter medicines. For symptoms you may take Tylenol as needed every 4-6 hours for body aches or fever, not to exceed 4,000 mg a day, Take mucinex or mucinex DM ever 12 hours with a full glass of water, you may use an inhaled steroid such as Flonase, 2 sprays each nostril once a day for congestion, or an antihistamine such as Claritin or Zyrtec once a day. For cough, I have prescribed a medication called Tessalon. Take 1 tablet every 8 hours as needed for your cough. Should your symptoms worsen or fail to resolve, follow up with your primary care provider or return to clinic.

## 2017-02-26 NOTE — ED Triage Notes (Signed)
PND, cough, onset last night.  Some aching, slight burn in chest with coughing.  No sore throat, no sneezing, no watery eyes.

## 2017-03-31 ENCOUNTER — Ambulatory Visit
Admission: RE | Admit: 2017-03-31 | Discharge: 2017-03-31 | Disposition: A | Discharge status: Home / Self Care | Payer: BLUE CROSS/BLUE SHIELD | Source: Ambulatory Visit | Attending: Interventional Radiology | Admitting: Interventional Radiology

## 2017-03-31 DIAGNOSIS — I83813 Varicose veins of bilateral lower extremities with pain: Secondary | ICD-10-CM

## 2017-03-31 DIAGNOSIS — M79605 Pain in left leg: Principal | ICD-10-CM

## 2017-03-31 DIAGNOSIS — M79604 Pain in right leg: Secondary | ICD-10-CM

## 2017-03-31 NOTE — Progress Notes (Signed)
Chief Complaint: Follow up SP varicose vein treatment.   Referring Physician(s): Dr. Evette Cristal  History of Present Illness: Veronica Sutton is a 61 y.o. female presenting today as a scheduled follow up appointment, status post treatment of bilateral lower extremity telangiectasias and right calf varicose veins, performed 02/24/17 by my partner Dr. Annamaria Boots.    Sclerotherapy was performed at 3 separate sites on the right (2) and the left (1).  She tells me she had some mild redness/swelling at the site of the right calf varicose vein a few days after the procedure, but that it resolved quickly on its own.  She has had no concerns since.   The night of the procedure, she tells me she was working outside and experienced a cough related to environmental allergens, and was treated the next day for fever/cough as a presumed bronchitis.  There was no associated problem with her surgical site at the legs, presumably unrelated.  This has since resolved.   She tells me that she can feel a palpable region on the right leg at the site of treatment, that is no longer painful, but "ropey".  She also says that she is very satisfied with the appearance of the resolving varicose veins after treatment.  The right telangiectasias are improved.  She questions possible increased conspicuity of the left popliteal telangiectasias.    Overall she is very happy with the result.    She has been wearing compression stockings after the procedure. She did not historically wear them before treatment.  We discussed wearing them as the best strategy for slowing down progression.    Past Medical History:  Diagnosis Date  . Anxiety   . Esophagitis   . GERD (gastroesophageal reflux disease)   . Migraine   . Vitamin D deficiency     Past Surgical History:  Procedure Laterality Date  . BREAST BIOPSY  1991   right    Allergies: Bacitracin-polymyxin b  Medications: Prior to Admission medications   Medication  Sig Start Date End Date Taking? Authorizing Provider  AMBULATORY NON FORMULARY MEDICATION Medication Name: Progesterone  1 tablet once daily x 12 days every 3 months     [provider]  benzonatate (TESSALON) 100 MG capsule Take 1 capsule (100 mg total) by mouth every 8 (eight) hours. 02/26/17   Barnet Glasgow, NP  Cholecalciferol (VITAMIN D) 1000 UNITS capsule Take 1,000 Units by mouth daily.      [provider]  doxycycline (VIBRA-TABS) 100 MG tablet Take 1 tablet (100 mg total) by mouth 2 (two) times daily. Patient not taking: Reported on 01/20/2017 03/27/14   Harden Mo, MD  estradiol (VIVELLE-DOT) 0.05 MG/24HR Place 1 patch onto the skin 2 (two) times a week.      [provider]     Family History  Problem Relation Age of Onset  . Heart attack Brother        ??? silent  . Pulmonary fibrosis Father        idiopathic  . Colon polyps Father   . Anxiety disorder Father   . Colon cancer Mother        ????  . Schizophrenia Mother        ?????  . Heart disease Paternal Grandfather   . Diabetes Maternal Grandfather   . Depression Brother     Social History   Social History  . Marital status: Married    Spouse name: N/A  . Number of children: N/A  .  Years of education: N/A   Social History Main Topics  . Smoking status: Never Smoker  . Smokeless tobacco: Never Used  . Alcohol use No  . Drug use: No  . Sexual activity: Not on file   Other Topics Concern  . Not on file   Social History Narrative  . No narrative on file     Review of Systems: A 12 point ROS discussed and pertinent positives are indicated in the HPI above.  All other systems are negative.  Review of Systems  Vital Signs: There were no vitals taken for this visit.  Physical Exam  Targeted physical exam of the bilateral lower extremity reveals no erythema at the site of treatment.  Palpable cord on the right at the medial calf, at the site of treatment.  Vague skin  darkening at this site overlying the palpable cord.  No skin breakdown.   There is small area of telangiectasia persisting on the posterior left calf.  No erythema or swelling.  No skin breakdown.   Mallampati Score:     Imaging: [place] Rad Eval And Mgmt  Result Date: 03/31/2017 Please refer to "Notes" to see consult details.   Labs:  CBC: No results for input(s): WBC, HGB, HCT, PLT in the last 8760 hours.  COAGS: No results for input(s): INR, APTT in the last 8760 hours.  BMP: No results for input(s): NA, K, CL, CO2, GLUCOSE, BUN, CALCIUM, CREATININE, GFRNONAA, GFRAA in the last 8760 hours.  Invalid input(s): CMP  LIVER FUNCTION TESTS: No results for input(s): BILITOT, AST, ALT, ALKPHOS, PROT, ALBUMIN in the last 8760 hours.  TUMOR MARKERS: No results for input(s): AFPTM, CEA, CA199, CHROMGRNA in the last 8760 hours.  Assessment and Plan:  [name] is a 61 year old female SP targeted sclerotherapy of right calf varicose veins, and both right and left telangiectasias of the calf.   There is no concern for any infection or ongoing thrombophlebitis at the treatment site.  Small region of skin darkening over the right anterior calf varicose vein was advised to be treated with moisturizer/aloe vera, and avoidance of sun exposure.   She seems quite satisfied with the result of the treatment, and the majority of our discussion was regarding natural history of vein disease and steps to take at this point for recovery and treating vein disease.  I advised continued use of compression stockings to slow the progression, which should be during the waking hours on a day to day basis.    I told her that we would not schedule a follow up at this time, and she may call us any any time for another appointment should she have any concerns for her current treatment or any concern for progression.    She seems satisfied with our treatment and our plan, and I answered all of her questions.    Plan: She should continue to wear compression stockings during waking hours.   She may contact us for any future follow up appointments.   Thank you for this interesting consult.  I greatly enjoyed meeting LAFAWN LENOIR and look forward to participating in their care.  A copy of this report was sent to the requesting provider on this date.  Electronically Signed: Corrie Mckusick 03/31/2017, 11:01 AM   I spent a total of    25 Minutes in face to face in clinical consultation, greater than 50% of which was counseling/coordinating care for lower extremity varicose veins/telangiectasia, SP targeted sclerotherapy.

## 2017-04-19 ENCOUNTER — Other Ambulatory Visit: Payer: Self-pay | Admitting: Obstetrics & Gynecology

## 2019-01-12 ENCOUNTER — Other Ambulatory Visit: Payer: Self-pay | Admitting: Internal Medicine

## 2019-01-12 DIAGNOSIS — M8588 Other specified disorders of bone density and structure, other site: Secondary | ICD-10-CM

## 2019-01-12 DIAGNOSIS — Z780 Asymptomatic menopausal state: Secondary | ICD-10-CM

## 2019-01-18 ENCOUNTER — Ambulatory Visit: Payer: BLUE CROSS/BLUE SHIELD | Admitting: Family Medicine

## 2019-01-21 NOTE — Progress Notes (Signed)
[facility] [address] [place], [place] [address] Phone: [phone] Subjective:   [name], am serving as a scribe for Dr. Hulan Saas.   CC: back pain   ERD:[id number]  Veronica Sutton is a 63 y.o. female coming in with complaint of back pain. Patient states that her right hip has been bothering her for months with an increase in pain over Christmas. Pain occurs in lower back that radiates into the glute and quad. Pain is achy in the back and glute. The quad pain is tingling. Does run without issue. Sitting for prolonged periods, rotation, and forward flexion increases her pain. Has seen Dr. Latanya Maudlin for piriformis syndrome. Has done some physical therapy but is unsure if therapy helped.    MRI from 2007 was independently visualized by me.  MRI of the lumbar spine showed mild facet arthropathy from L3-S1 but otherwise fairly unremarkable.  Past Medical History:  Diagnosis Date  . Anxiety   . Esophagitis   . GERD (gastroesophageal reflux disease)   . Migraine   . Vitamin D deficiency    Past Surgical History:  Procedure Laterality Date  . BREAST BIOPSY  1991   right   Social History   Socioeconomic History  . Marital status: Married    Spouse name: Not on file  . Number of children: Not on file  . Years of education: Not on file  . Highest education level: Not on file  Occupational History  . Not on file  Social Needs  . Financial resource strain: Not on file  . Food insecurity:    Worry: Not on file    Inability: Not on file  . Transportation needs:    Medical: Not on file    Non-medical: Not on file  Tobacco Use  . Smoking status: Never Smoker  . Smokeless tobacco: Never Used  Substance and Sexual Activity  . Alcohol use: No  . Drug use: No  . Sexual activity: Not on file  Lifestyle  . Physical activity:    Days per week: Not on file    Minutes per session: Not on file  . Stress: Not on file  Relationships  . Social  connections:    Talks on phone: Not on file    Gets together: Not on file    Attends religious service: Not on file    Active member of club or organization: Not on file    Attends meetings of clubs or organizations: Not on file    Relationship status: Not on file  Other Topics Concern  . Not on file  Social History Narrative  . Not on file   Allergies  Allergen Reactions  . Bacitracin-Polymyxin B     Rash after using neosporin   Family History  Problem Relation Age of Onset  . Heart attack Brother        ??? silent  . Pulmonary fibrosis Father        idiopathic  . Colon polyps Father   . Anxiety disorder Father   . Colon cancer Mother        ????  . Schizophrenia Mother        ?????  . Heart disease Paternal Grandfather   . Diabetes Maternal Grandfather   . Depression Brother     Current Outpatient Medications (Endocrine & Metabolic):  .  estradiol (VIVELLE-DOT) 0.05 MG/24HR, Place 1 patch onto the skin 2 (two) times a week.  Current Outpatient Medications (Respiratory):  .  benzonatate (TESSALON) 100 MG capsule, Take 1 capsule (100 mg total) by mouth every 8 (eight) hours.    Current Outpatient Medications (Other):  Marland Kitchen  AMBULATORY NON FORMULARY MEDICATION, Medication Name: Progesterone  1 tablet once daily x 12 days every 3 months  .  Cholecalciferol (VITAMIN D) 1000 UNITS capsule, Take 1,000 Units by mouth daily.   Marland Kitchen  doxycycline (VIBRA-TABS) 100 MG tablet, Take 1 tablet (100 mg total) by mouth 2 (two) times daily.    Past medical history, social, surgical and family history all reviewed in electronic medical record.  No pertanent information unless stated regarding to the chief complaint.   Review of Systems:  No headache, visual changes, nausea, vomiting, diarrhea, constipation, dizziness, abdominal pain, skin rash, fevers, chills, night sweats, weight loss, swollen lymph nodes, body aches, joint swelling, , chest pain, shortness of breath, mood changes.   Positive muscle aches  Objective  Blood pressure 132/82, pulse 63, height 5\' 4"  (1.626 m), weight 113 lb (51.3 kg), SpO2 99 %.    General: No apparent distress alert and oriented x3 mood and affect normal, dressed appropriately.  HEENT: Pupils equal, extraocular movements intact  Respiratory: Patient's speak in full sentences and does not appear short of breath  Cardiovascular: No lower extremity edema, non tender, no erythema  Skin: Warm dry intact with no signs of infection or rash on extremities or on axial skeleton.  Abdomen: Soft nontender  Neuro: Cranial nerves II through XII are intact, neurovascularly intact in all extremities with 2+ DTRs and 2+ pulses.  Lymph: No lymphadenopathy of posterior or anterior cervical chain or axillae bilaterally.  Gait normal with good balance and coordination.  MSK:  Non tender with full range of motion and good stability and symmetric strength and tone of shoulders, elbows, wrist, hip, knee and ankles bilaterally.  Back Exam:  Inspection: Lordosis Motion: Flexion 35 deg, Extension 25 deg, Side Bending to 35 deg bilaterally,  Rotation to 35 deg bilaterally  SLR laying: Negative  tightness in the hamstring on the right XSLR laying: Negative pain on the right side of the back Palpable tenderness: Tender to palpation of paraspinal musculature lumbar spine recurrent left. FABER: Positive Faber right. Sensory change: Gross sensation intact to all lumbar and sacral dermatomes.  Reflexes: 2+ at both patellar tendons, 2+ at achilles tendons, Babinski's downgoing.  Strength at foot  Plantar-flexion: 5/5 Dorsi-flexion: 5/5 Eversion: 5/5 Inversion: 5/5  Leg strength  Quad: 5/5 Hamstring: 5/5 Hip flexor: 5/5 Hip abductors: 5/5  Gait unremarkable.  [id number]; 15 additional minutes spent for Therapeutic exercises as stated in above notes.  This included exercises focusing on stretching, strengthening, with significant focus on eccentric aspects.   Long term goals  include an improvement in range of motion, strength, endurance as well as avoiding reinjury. Patient's frequency would include in 1-2 times a day, 3-5 times a week for a duration of 6-12 weeks. Low back exercises that included:  Pelvic tilt/bracing instruction to focus on control of the pelvic girdle and lower abdominal muscles  Glute strengthening exercises, focusing on proper firing of the glutes without engaging the low back muscles Proper stretching techniques for maximum relief for the hamstrings, hip flexors, low back and some rotation where tolerated    Proper technique shown and discussed handout in great detail with ATC.  All questions were discussed and answered.     Impression and Recommendations:     This case required medical decision making of  moderate complexity. The above documentation has been reviewed and is accurate and complete Lyndal Pulley, DO       Note: This dictation was prepared with Dragon dictation along with smaller phrase technology. Any transcriptional errors that result from this process are unintentional.

## 2019-01-22 ENCOUNTER — Ambulatory Visit: Payer: BLUE CROSS/BLUE SHIELD | Admitting: Family Medicine

## 2019-01-22 ENCOUNTER — Encounter: Payer: Self-pay | Admitting: Family Medicine

## 2019-01-22 ENCOUNTER — Ambulatory Visit (INDEPENDENT_AMBULATORY_CARE_PROVIDER_SITE_OTHER)
Admission: RE | Admit: 2019-01-22 | Discharge: 2019-01-22 | Disposition: A | Discharge status: Home / Self Care | Payer: BLUE CROSS/BLUE SHIELD | Source: Ambulatory Visit | Attending: Family Medicine | Admitting: Family Medicine

## 2019-01-22 VITALS — BP 132/82 | HR 63 | Ht 64.0 in | Wt 113.0 lb

## 2019-01-22 DIAGNOSIS — M545 Low back pain, unspecified: Secondary | ICD-10-CM | POA: Insufficient documentation

## 2019-01-22 NOTE — Patient Instructions (Signed)
Good to see you  Ice 20 minutes 2 times daily. Usually after activity and before bed. pennsaid pinkie amount topically 2 times daily as needed.  Xray downstairs today  Over the counter get  Vitamin D 2000 IU daily  Turmeric 500mg  daily  Tart cherry extract any dose at night Try running only 1-2 days a weeks Biking rowing or elliptical  See me again in 4-6 weeks

## 2019-01-22 NOTE — Assessment & Plan Note (Signed)
She has more of a low back pain.  Discussed with patient in great length.  Discussed home exercises, icing regimen, which activities to do which wants to avoid.  Patient is to increase activity slowly over the course of next several days.  Discussed the possibility gabapentin which patient declined at the moment.  X-rays further evaluate see what type of progression there has been since the MRI in 2007. Discussed differential includes the possibility of piriformis syndrome and will consider injection if her have worsening symptoms.  Patient will try the conservative therapy first and follow-up again in 4 weeks.  Patient adamant she would like to continue to run.

## 2019-02-20 ENCOUNTER — Ambulatory Visit: Payer: Self-pay | Admitting: Family Medicine

## 2019-03-13 NOTE — Progress Notes (Deleted)
[name] [address], [place] [address] Phone: [phone] Subjective:    I'm seeing this patient by the request  of:    CC: back pain follow-up  EHU:[id number]  Veronica Sutton is a 63 y.o. female coming in with complaint of ***  Onset-  Location Duration-  Character- Aggravating factors- Reliving factors-  Therapies tried-  Severity-   Patient did have x-rays taken on January 23, 2019.  Independently visualized by me showing no significant bony abnormality.  Very minimal right-sided scoliosis once again MRI from 2007 showed that patient did have mild disc bulging and facet arthropathy at L3-S1  Past Medical History:  Diagnosis Date  . Anxiety   . Esophagitis   . GERD (gastroesophageal reflux disease)   . Migraine   . Vitamin D deficiency    Past Surgical History:  Procedure Laterality Date  . BREAST BIOPSY  1991   right   Social History   Socioeconomic History  . Marital status: Married    Spouse name: Not on file  . Number of children: Not on file  . Years of education: Not on file  . Highest education level: Not on file  Occupational History  . Not on file  Social Needs  . Financial resource strain: Not on file  . Food insecurity:    Worry: Not on file    Inability: Not on file  . Transportation needs:    Medical: Not on file    Non-medical: Not on file  Tobacco Use  . Smoking status: Never Smoker  . Smokeless tobacco: Never Used  Substance and Sexual Activity  . Alcohol use: No  . Drug use: No  . Sexual activity: Not on file  Lifestyle  . Physical activity:    Days per week: Not on file    Minutes per session: Not on file  . Stress: Not on file  Relationships  . Social connections:    Talks on phone: Not on file    Gets together: Not on file    Attends religious service: Not on file    Active member of club or organization: Not on file    Attends meetings of clubs or organizations: Not on file   Relationship status: Not on file  Other Topics Concern  . Not on file  Social History Narrative  . Not on file   Allergies  Allergen Reactions  . Bacitracin-Polymyxin B     Rash after using neosporin   Family History  Problem Relation Age of Onset  . Heart attack Brother        ??? silent  . Pulmonary fibrosis Father        idiopathic  . Colon polyps Father   . Anxiety disorder Father   . Colon cancer Mother        ????  . Schizophrenia Mother        ?????  . Heart disease Paternal Grandfather   . Diabetes Maternal Grandfather   . Depression Brother     Current Outpatient Medications (Endocrine & Metabolic):  .  estradiol (VIVELLE-DOT) 0.05 MG/24HR, Place 1 patch onto the skin 2 (two) times a week.     Current Outpatient Medications (Respiratory):  .  benzonatate (TESSALON) 100 MG capsule, Take 1 capsule (100 mg total) by mouth every 8 (eight) hours.    Current Outpatient Medications (Other):  Marland Kitchen  AMBULATORY NON FORMULARY MEDICATION, Medication Name: Progesterone  1 tablet once daily x 12  days every 3 months  .  Cholecalciferol (VITAMIN D) 1000 UNITS capsule, Take 1,000 Units by mouth daily.   Marland Kitchen  doxycycline (VIBRA-TABS) 100 MG tablet, Take 1 tablet (100 mg total) by mouth 2 (two) times daily.    Past medical history, social, surgical and family history all reviewed in electronic medical record.  No pertanent information unless stated regarding to the chief complaint.   Review of Systems:  No headache, visual changes, nausea, vomiting, diarrhea, constipation, dizziness, abdominal pain, skin rash, fevers, chills, night sweats, weight loss, swollen lymph nodes, body aches, joint swelling, muscle aches, chest pain, shortness of breath, mood changes.   Objective  There were no vitals taken for this visit. Systems examined below as of    General: No apparent distress alert and oriented x3 mood and affect normal, dressed appropriately.  HEENT: Pupils equal, extraocular  movements intact  Respiratory: Patient's speak in full sentences and does not appear short of breath  Cardiovascular: No lower extremity edema, non tender, no erythema  Skin: Warm dry intact with no signs of infection or rash on extremities or on axial skeleton.  Abdomen: Soft nontender  Neuro: Cranial nerves II through XII are intact, neurovascularly intact in all extremities with 2+ DTRs and 2+ pulses.  Lymph: No lymphadenopathy of posterior or anterior cervical chain or axillae bilaterally.  Gait normal with good balance and coordination.  MSK:  Non tender with full range of motion and good stability and symmetric strength and tone of shoulders, elbows, wrist, hip, knee and ankles bilaterally.  Back Exam:  Inspection: Unremarkable  Motion: Flexion 45 deg, Extension 45 deg, Side Bending to 45 deg bilaterally,  Rotation to 45 deg bilaterally  SLR laying: Negative  XSLR laying: Negative  Palpable tenderness: None. FABER: negative. Sensory change: Gross sensation intact to all lumbar and sacral dermatomes.  Reflexes: 2+ at both patellar tendons, 2+ at achilles tendons, Babinski's downgoing.  Strength at foot  Plantar-flexion: 5/5 Dorsi-flexion: 5/5 Eversion: 5/5 Inversion: 5/5  Leg strength  Quad: 5/5 Hamstring: 5/5 Hip flexor: 5/5 Hip abductors: 5/5  Gait unremarkable.   Impression and Recommendations:     This case required medical decision making of moderate complexity. The above documentation has been reviewed and is accurate and complete Lyndal Pulley, DO       Note: This dictation was prepared with Dragon dictation along with smaller phrase technology. Any transcriptional errors that result from this process are unintentional.

## 2019-03-14 ENCOUNTER — Ambulatory Visit: Payer: Self-pay | Admitting: Family Medicine

## 2020-02-22 ENCOUNTER — Ambulatory Visit: Payer: BLUE CROSS/BLUE SHIELD | Attending: Internal Medicine

## 2020-02-22 DIAGNOSIS — Z23 Encounter for immunization: Secondary | ICD-10-CM

## 2020-02-22 NOTE — Progress Notes (Signed)
   Covid-19 Vaccination Clinic  Name:  [name]    MRN: [id number] DOB: [birth year]  02/22/2020  [name] was observed post Covid-19 immunization for 15 minutes without incident. She was provided with Vaccine Information Sheet and instruction to access the V-Safe system.   [name] was instructed to call 911 with any severe reactions post vaccine: Marland Kitchen Difficulty breathing  . Swelling of face and throat  . A fast heartbeat  . A bad rash all over body  . Dizziness and weakness   Immunizations Administered    Name Date Dose VIS Date Route   Pfizer COVID-19 Vaccine 02/22/2020 10:24 AM 0.3 mL 11/02/2019 Intramuscular   Manufacturer: [place]   Lot: [id number]   [place]: [id number]

## 2020-03-14 ENCOUNTER — Ambulatory Visit: Payer: Self-pay | Attending: Internal Medicine

## 2020-03-14 DIAGNOSIS — Z23 Encounter for immunization: Secondary | ICD-10-CM

## 2020-03-14 NOTE — Progress Notes (Signed)
   Covid-19 Vaccination Clinic  Name:  [facility]    MRN: [id number] DOB: [birth year]  03/14/2020  [name] was observed post Covid-19 immunization for 15 minutes without incident. She was provided with Vaccine Information Sheet and instruction to access the V-Safe system.   [name] was instructed to call 911 with any severe reactions post vaccine: Marland Kitchen Difficulty breathing  . Swelling of face and throat  . A fast heartbeat  . A bad rash all over body  . Dizziness and weakness   Immunizations Administered    Name Date Dose VIS Date Route   Pfizer COVID-19 Vaccine 03/14/2020 10:09 AM 0.3 mL 01/16/2019 Intramuscular   Manufacturer: [place]   Lot: [id number]   [place]: [id number]

## 2020-03-18 ENCOUNTER — Ambulatory Visit: Payer: BLUE CROSS/BLUE SHIELD

## 2020-06-06 ENCOUNTER — Encounter: Payer: Self-pay | Admitting: Gastroenterology

## 2020-07-30 ENCOUNTER — Encounter: Payer: Self-pay | Admitting: Gastroenterology

## 2020-07-30 ENCOUNTER — Ambulatory Visit (INDEPENDENT_AMBULATORY_CARE_PROVIDER_SITE_OTHER): Payer: 59 | Admitting: Gastroenterology

## 2020-07-30 VITALS — BP 140/80 | HR 88 | Ht 64.25 in | Wt 105.6 lb

## 2020-07-30 DIAGNOSIS — R142 Eructation: Secondary | ICD-10-CM

## 2020-07-30 DIAGNOSIS — R0789 Other chest pain: Secondary | ICD-10-CM

## 2020-07-30 DIAGNOSIS — K219 Gastro-esophageal reflux disease without esophagitis: Secondary | ICD-10-CM | POA: Diagnosis not present

## 2020-07-30 DIAGNOSIS — Z1211 Encounter for screening for malignant neoplasm of colon: Secondary | ICD-10-CM

## 2020-07-30 MED ORDER — FAMOTIDINE 20 MG PO TABS: 20.0000 mg | ORAL_TABLET | Freq: Two times a day (BID) | ORAL | 3 refills | Status: AC

## 2020-07-30 MED ORDER — SUTAB 1479-225-188 MG PO TABS: ORAL_TABLET | ORAL | 0 refills | Status: DC

## 2020-07-30 NOTE — Progress Notes (Signed)
Veronica Sutton    [id number]    October 14, 1956  Primary Care Physician:Blackford, Estill Dooms, MD  Referring Physician: Barbra Sarks, MD [address] [place],  [place] [address]   Chief complaint: Belching, atypical chest pain, colorectal cancer screening HPI:  64 year old very pleasant female here for new patient visit to discuss colorectal cancer screening, atypical chest pain and excessive belching  She has intermittent burning sensation and sharp pain middle of her chest, worse when she tries to swallow or drink anything.  She also has excessive belching when she wakes up in the morning on some days.  Occasional choking sensation when she swallows but denies any dysphagia or odynophagia No melena, rectal bleeding, abdominal pain or vomiting No weight loss or decreased appetite  She was previously followed by Dr. Olevia Perches, last visit in 2012  EGD 01/16/2009: Normal  Colonoscopy May 16, 2009: Normal  She does not know if her mother really had colon cancer, did not get definitive diagnosis.  She was admitted with large bowel obstruction and subsequently passed away from it.  Denies any nausea, vomiting, abdominal pain, melena or bright red blood per rectum  Upper GI series July 08, 2011:Normal  Gastric emptying scan August 17, 2005: Normal  Outpatient Encounter Medications as of 07/30/2020  Medication Sig  . Cholecalciferol (VITAMIN D) 1000 UNITS capsule Take 1,000 Units by mouth daily.    Marland Kitchen estradiol (VIVELLE-DOT) 0.025 MG/24HR Place 1 patch onto the skin 2 (two) times a week.  . progesterone (PROMETRIUM) 100 MG capsule Take 100 mg by mouth daily.  . vitamin B-12 (CYANOCOBALAMIN) 500 MCG tablet Take 500 mcg by mouth daily.  . [DISCONTINUED] AMBULATORY NON FORMULARY MEDICATION Medication Name: Progesterone  1 tablet once daily x 12 days every 3 months   . [DISCONTINUED] benzonatate (TESSALON) 100 MG capsule Take 1 capsule (100 mg total) by mouth every 8 (eight)  hours.  . [DISCONTINUED] doxycycline (VIBRA-TABS) 100 MG tablet Take 1 tablet (100 mg total) by mouth 2 (two) times daily.  . [DISCONTINUED] estradiol (VIVELLE-DOT) 0.05 MG/24HR Place 1 patch onto the skin 2 (two) times a week.     No facility-administered encounter medications on file as of 07/30/2020.    Allergies as of 07/30/2020  . (No Active Allergies)    Past Medical History:  Diagnosis Date  . Anxiety   . Esophagitis   . GERD (gastroesophageal reflux disease)   . Migraine   . Pneumonia   . Vitamin D deficiency     Past Surgical History:  Procedure Laterality Date  . BREAST BIOPSY  1991   right    Family History  Problem Relation Age of Onset  . Pulmonary fibrosis Father        idiopathic  . Colon polyps Father   . Depression Mother   . Heart disease Paternal Grandfather   . Diabetes Maternal Grandfather        questionable  . Depression Brother   . Diabetes Sister     Social History   Socioeconomic History  . Marital status: Married    Spouse name: Not on file  . Number of children: Not on file  . Years of education: Not on file  . Highest education level: Not on file  Occupational History  . Not on file  Tobacco Use  . Smoking status: Never Smoker  . Smokeless tobacco: Never Used  Substance and Sexual Activity  . Alcohol use: No  Comment: < 1 drink daily  . Drug use: No  . Sexual activity: Not on file  Other Topics Concern  . Not on file  Social History Narrative  . Not on file   Social Determinants of Health   Financial Resource Strain:   . Difficulty of Paying Living Expenses: Not on file  Food Insecurity:   . Worried About Charity fundraiser in the Last Year: Not on file  . Ran Out of Food in the Last Year: Not on file  Transportation Needs:   . Lack of Transportation (Medical): Not on file  . Lack of Transportation (Non-Medical): Not on file  Physical Activity:   . Days of Exercise per Week: Not on file  . Minutes of Exercise per  Session: Not on file  Stress:   . Feeling of Stress : Not on file  Social Connections:   . Frequency of Communication with Friends and Family: Not on file  . Frequency of Social Gatherings with Friends and Family: Not on file  . Attends Religious Services: Not on file  . Active Member of Clubs or Organizations: Not on file  . Attends Archivist Meetings: Not on file  . Marital Status: Not on file  Intimate Partner Violence:   . Fear of Current or Ex-Partner: Not on file  . Emotionally Abused: Not on file  . Physically Abused: Not on file  . Sexually Abused: Not on file      Review of systems: All other review of systems negative except as mentioned in the HPI.   Physical Exam: Vitals:   07/30/20 1006  BP: 140/80  Pulse: 88  SpO2: 99%   Body mass index is 17.99 kg/m. Gen:      No acute distress HEENT:  sclera anicteric Abd:      soft, non-tender; no palpable masses, no distension Ext:    No edema Neuro: alert and oriented x 3 Psych: normal mood and affect  Data Reviewed:  Reviewed labs, radiology imaging, old records and pertinent past GI work up   Assessment and Plan/Recommendations:   64 year old very pleasant female here to discuss colorectal cancer screening  She is due for screening colonoscopy, will schedule it The risks and benefits as well as alternatives of endoscopic procedure(s) have been discussed and reviewed. All questions answered. The patient agrees to proceed.  Atypical chest pain, belching and heartburn likely secondary to uncontrolled gastroesophageal acid reflux Patient worried about potential side effects with use of PPI Start famotidine 20 mg twice daily, discussed in detail benefits and risks associated with use of chronic acid suppressive medication Discussed antireflux measures  If continues to have persistent symptoms, will consider EGD for further evaluation  Return in 6 months or sooner if needed  The patient was  provided an opportunity to ask questions and all were answered. The patient agreed with the plan and demonstrated an understanding of the instructions.  Damaris Hippo , MD    CC: Blackford, Estill Dooms, MD

## 2020-07-30 NOTE — Patient Instructions (Addendum)
You have been scheduled for a colonoscopy. Please follow written instructions given to you at your visit today.  Please pick up your prep supplies at the pharmacy within the next 1-3 days. If you use inhalers (even only as needed), please bring them with you on the day of your procedure.   Due to recent changes in healthcare laws, you may see the results of your imaging and laboratory studies on MyChart before your provider has had a chance to review them.  We understand that in some cases there may be results that are confusing or concerning to you. Not all laboratory results come back in the same time frame and the provider may be waiting for multiple results in order to interpret others.  Please give [place] 48 hours in order for your provider to thoroughly review all the results before contacting the office for clarification of your results.    Gastroesophageal Reflux Disease, Adult Gastroesophageal reflux (GER) happens when acid from the stomach flows up into the tube that connects the mouth and the stomach (esophagus). Normally, food travels down the esophagus and stays in the stomach to be digested. However, when a person has GER, food and stomach acid sometimes move back up into the esophagus. If this becomes a more serious problem, the person may be diagnosed with a disease called gastroesophageal reflux disease (GERD). GERD occurs when the reflux:  Happens often.  Causes frequent or severe symptoms.  Causes problems such as damage to the esophagus. When stomach acid comes in contact with the esophagus, the acid may cause soreness (inflammation) in the esophagus. Over time, GERD may create small holes (ulcers) in the lining of the esophagus. What are the causes? This condition is caused by a problem with the muscle between the esophagus and the stomach (lower esophageal sphincter, or LES). Normally, the LES muscle closes after food passes through the esophagus to the stomach. When the LES is  weakened or abnormal, it does not close properly, and that allows food and stomach acid to go back up into the esophagus. The LES can be weakened by certain dietary substances, medicines, and medical conditions, including:  Tobacco use.  Pregnancy.  Having a hiatal hernia.  Alcohol use.  Certain foods and beverages, such as coffee, chocolate, onions, and peppermint. What increases the risk? You are more likely to develop this condition if you:  Have an increased body weight.  Have a connective tissue disorder.  Use NSAID medicines. What are the signs or symptoms? Symptoms of this condition include:  Heartburn.  Difficult or painful swallowing.  The feeling of having a lump in the throat.  Abitter taste in the mouth.  Bad breath.  Having a large amount of saliva.  Having an upset or bloated stomach.  Belching.  Chest pain. Different conditions can cause chest pain. Make sure you see your health care provider if you experience chest pain.  Shortness of breath or wheezing.  Ongoing (chronic) cough or a night-time cough.  Wearing away of tooth enamel.  Weight loss. How is this diagnosed? Your health care provider will take a medical history and perform a physical exam. To determine if you have mild or severe GERD, your health care provider may also monitor how you respond to treatment. You may also have tests, including:  A test to examine your stomach and esophagus with a small camera (endoscopy).  A test thatmeasures the acidity level in your esophagus.  A test thatmeasures how much pressure is on your  esophagus.  A barium swallow or modified barium swallow test to show the shape, size, and functioning of your esophagus. How is this treated? The goal of treatment is to help relieve your symptoms and to prevent complications. Treatment for this condition may vary depending on how severe your symptoms are. Your health care provider may recommend:  Changes  to your diet.  Medicine.  Surgery. Follow these instructions at home: Eating and drinking   Follow a diet as recommended by your health care provider. This may involve avoiding foods and drinks such as: ? Coffee and tea (with or without caffeine). ? Drinks that containalcohol. ? Energy drinks and sports drinks. ? Carbonated drinks or sodas. ? Chocolate and cocoa. ? Peppermint and mint flavorings. ? Garlic and onions. ? Horseradish. ? Spicy and acidic foods, including peppers, chili powder, curry powder, vinegar, hot sauces, and barbecue sauce. ? Citrus fruit juices and citrus fruits, such as oranges, lemons, and limes. ? Tomato-based foods, such as red sauce, chili, salsa, and pizza with red sauce. ? Fried and fatty foods, such as donuts, french fries, potato chips, and high-fat dressings. ? High-fat meats, such as hot dogs and fatty cuts of red and white meats, such as rib eye steak, sausage, ham, and bacon. ? High-fat dairy items, such as whole milk, butter, and cream cheese.  Eat small, frequent meals instead of large meals.  Avoid drinking large amounts of liquid with your meals.  Avoid eating meals during the 2-3 hours before bedtime.  Avoid lying down right after you eat.  Do not exercise right after you eat. Lifestyle   Do not use any products that contain nicotine or tobacco, such as cigarettes, e-cigarettes, and chewing tobacco. If you need help quitting, ask your health care provider.  Try to reduce your stress by using methods such as yoga or meditation. If you need help reducing stress, ask your health care provider.  If you are overweight, reduce your weight to an amount that is healthy for you. Ask your health care provider for guidance about a safe weight loss goal. General instructions  Pay attention to any changes in your symptoms.  Take over-the-counter and prescription medicines only as told by your health care provider. Do not take aspirin,  ibuprofen, or other NSAIDs unless your health care provider told you to do so.  Wear loose-fitting clothing. Do not wear anything tight around your waist that causes pressure on your abdomen.  Raise (elevate) the head of your bed about 6 inches (15 cm).  Avoid bending over if this makes your symptoms worse.  Keep all follow-up visits as told by your health care provider. This is important. Contact a health care provider if:  You have: ? New symptoms. ? Unexplained weight loss. ? Difficulty swallowing or it hurts to swallow. ? Wheezing or a persistent cough. ? A hoarse voice.  Your symptoms do not improve with treatment. Get help right away if you:  Have pain in your arms, neck, jaw, teeth, or back.  Feel sweaty, dizzy, or light-headed.  Have chest pain or shortness of breath.  Vomit and your vomit looks like blood or coffee grounds.  Faint.  Have stool that is bloody or black.  Cannot swallow, drink, or eat. Summary  Gastroesophageal reflux happens when acid from the stomach flows up into the esophagus. GERD is a disease in which the reflux happens often, causes frequent or severe symptoms, or causes problems such as damage to the esophagus.  Treatment for  this condition may vary depending on how severe your symptoms are. Your health care provider may recommend diet and lifestyle changes, medicine, or surgery.  Contact a health care provider if you have new or worsening symptoms.  Take over-the-counter and prescription medicines only as told by your health care provider. Do not take aspirin, ibuprofen, or other NSAIDs unless your health care provider told you to do so.  Keep all follow-up visits as told by your health care provider. This is important. This information is not intended to replace advice given to you by your health care provider. Make sure you discuss any questions you have with your health care provider. Document Revised: 05/17/2018 Document Reviewed:  05/17/2018 Elsevier Patient Education  [employer].  I appreciate the  opportunity to care for you  Thank You   Harl Bowie , MD

## 2020-08-01 ENCOUNTER — Encounter: Payer: Self-pay | Admitting: Gastroenterology

## 2020-08-08 ENCOUNTER — Telehealth: Payer: Self-pay | Admitting: Gastroenterology

## 2020-08-08 NOTE — Telephone Encounter (Signed)
Spoke with the patient. She requests an opportunity to discuss the documentation from the recent visit. Understands the doctor is out of the office. She would like for the doctor to have access to this chart when they speak. Thanks

## 2020-09-10 ENCOUNTER — Encounter: Payer: 59 | Admitting: Gastroenterology

## 2020-09-24 ENCOUNTER — Encounter: Payer: Self-pay | Admitting: Gastroenterology

## 2020-09-29 ENCOUNTER — Telehealth: Payer: Self-pay | Admitting: Gastroenterology

## 2020-09-29 NOTE — Telephone Encounter (Signed)
Pt is scheduled to see Dr. Silverio Decamp on 09/30/20. Pt stated that she had been eating nuts, seeds and popcorn. Advised patient that she could keep her appointment for tomorrow and to drink as much fluid as she can tolerate to help flush out the seeds. Pt verbalized understanding and will call office back if she has any other concerns.

## 2020-09-29 NOTE — Telephone Encounter (Signed)
Pt is requesting a call back from a nurse to discuss her colonoscopy scheduled for tomorrow 11/9. Pt states she has had nuts, and seeds in the last 5 days, pt would like to know if she needs to keep her procedure tomorrow.

## 2020-09-30 ENCOUNTER — Encounter: Payer: Self-pay | Admitting: Gastroenterology

## 2020-09-30 ENCOUNTER — Other Ambulatory Visit: Payer: Self-pay

## 2020-09-30 ENCOUNTER — Ambulatory Visit (AMBULATORY_SURGERY_CENTER): Payer: Medicare Other | Admitting: Gastroenterology

## 2020-09-30 VITALS — BP 107/61 | HR 60 | Temp 97.8°F | Resp 10 | Ht 64.25 in | Wt 105.0 lb

## 2020-09-30 DIAGNOSIS — D125 Benign neoplasm of sigmoid colon: Secondary | ICD-10-CM

## 2020-09-30 DIAGNOSIS — Z1211 Encounter for screening for malignant neoplasm of colon: Secondary | ICD-10-CM

## 2020-09-30 DIAGNOSIS — D124 Benign neoplasm of descending colon: Secondary | ICD-10-CM

## 2020-09-30 MED ORDER — SODIUM CHLORIDE 0.9 % IV SOLN: 500.0000 mL | Freq: Once | INTRAVENOUS | Status: DC

## 2020-09-30 NOTE — Op Note (Signed)
[facility] Patient Name: Veronica Sutton Procedure Date: 09/30/2020 8:59 AM MRN: [id number] Endoscopist: Mauri Pole , MD Age: 64 Referring MD:  Date of Birth: [birth year] Gender: Female Account #: [id number] Procedure:                Colonoscopy Indications:              Screening for colorectal malignant neoplasm Medicines:                Monitored Anesthesia Care Procedure:                Pre-Anesthesia Assessment:                           - Prior to the procedure, a History and Physical                            was performed, and patient medications and                            allergies were reviewed. The patient's tolerance of                            previous anesthesia was also reviewed. The risks                            and benefits of the procedure and the sedation                            options and risks were discussed with the patient.                            All questions were answered, and informed consent                            was obtained. Prior Anticoagulants: The patient has                            taken no previous anticoagulant or antiplatelet                            agents. ASA Grade Assessment: II - A patient with                            mild systemic disease. After reviewing the risks                            and benefits, the patient was deemed in                            satisfactory condition to undergo the procedure.                           After obtaining informed consent, the colonoscope  was passed under direct vision. Throughout the                            procedure, the patient's blood pressure, pulse, and                            oxygen saturations were monitored continuously. The                            Colonoscope was introduced through the anus and                            advanced to the the cecum, identified by                            appendiceal orifice  and ileocecal valve. The                            colonoscopy was performed without difficulty. The                            patient tolerated the procedure well. The quality                            of the bowel preparation was good. The ileocecal                            valve, appendiceal orifice, and rectum were                            photographed. Scope In: 9:10:33 AM Scope Out: 9:29:26 AM Scope Withdrawal Time: 0 hours 12 minutes 33 seconds  Total Procedure Duration: 0 hours 18 minutes 53 seconds  Findings:                 The perianal and digital rectal examinations were                            normal.                           A less than 1 mm polyp was found in the descending                            colon. The polyp was sessile. The polyp was removed                            with a cold biopsy forceps. Resection and retrieval                            were complete.                           Two sessile polyps were found in the sigmoid colon  and descending colon. The polyps were 4 to 6 mm in                            size. These polyps were removed with a cold snare.                            Resection and retrieval were complete.                           Non-bleeding internal hemorrhoids were found during                            retroflexion. The hemorrhoids were small. Complications:            No immediate complications. Estimated Blood Loss:     Estimated blood loss was minimal. Impression:               - One less than 1 mm polyp in the descending colon,                            removed with a cold biopsy forceps. Resected and                            retrieved.                           - Two 4 to 6 mm polyps in the sigmoid colon and in                            the descending colon, removed with a cold snare.                            Resected and retrieved.                           - Non-bleeding internal  hemorrhoids. Recommendation:           - Patient has a contact number available for                            emergencies. The signs and symptoms of potential                            delayed complications were discussed with the                            patient. Return to normal activities tomorrow.                            Written discharge instructions were provided to the                            patient.                           -  Resume previous diet.                           - Continue present medications.                           - Await pathology results.                           - Repeat colonoscopy in 3 - 5 years for                            surveillance based on pathology results. Mauri Pole, MD 09/30/2020 9:38:24 AM This report has been signed electronically.

## 2020-09-30 NOTE — Progress Notes (Signed)
A and O x3. Report to RN. Tolerated MAC anesthesia well.

## 2020-09-30 NOTE — Progress Notes (Signed)
VS taken by C.W. 

## 2020-09-30 NOTE — Progress Notes (Signed)
Called to room to assist during endoscopic procedure.  Patient ID and intended procedure confirmed with present staff. Received instructions for my participation in the procedure from the performing physician.  

## 2020-09-30 NOTE — Patient Instructions (Signed)
Read all of the handouts given to you by your recovery room nurse.  Thank-you for choosing [place] for your healthcare needs today.  YOU HAD AN ENDOSCOPIC PROCEDURE TODAY AT [facility]:   Refer to the procedure report that was given to you for any specific questions about what was found during the examination.  If the procedure report does not answer your questions, please call your gastroenterologist to clarify.  If you requested that your care partner not be given the details of your procedure findings, then the procedure report has been included in a sealed envelope for you to review at your convenience later.  YOU SHOULD EXPECT: Some feelings of bloating in the abdomen. Passage of more gas than usual.  Walking can help get rid of the air that was put into your GI tract during the procedure and reduce the bloating. If you had a lower endoscopy (such as a colonoscopy or flexible sigmoidoscopy) you may notice spotting of blood in your stool or on the toilet paper. If you underwent a bowel prep for your procedure, you may not have a normal bowel movement for a few days.  Please Note:  You might notice some irritation and congestion in your nose or some drainage.  This is from the oxygen used during your procedure.  There is no need for concern and it should clear up in a day or so.  SYMPTOMS TO REPORT IMMEDIATELY:   Following lower endoscopy (colonoscopy or flexible sigmoidoscopy):  Excessive amounts of blood in the stool  Significant tenderness or worsening of abdominal pains  Swelling of the abdomen that is new, acute  Fever of 100F or higher   For urgent or emergent issues, a gastroenterologist can be reached at any hour by calling [phone]. Do not use MyChart messaging for urgent concerns.    DIET:  We do recommend a small meal at first, but then you may proceed to your regular diet.  Drink plenty of fluids but you should avoid alcoholic beverages for 24  hours.  ACTIVITY:  You should plan to take it easy for the rest of today and you should NOT DRIVE or use heavy machinery until tomorrow (because of the sedation medicines used during the test).    FOLLOW UP: Our staff will call the number listed on your records 48-72 hours following your procedure to check on you and address any questions or concerns that you may have regarding the information given to you following your procedure. If we do not reach you, we will leave a message.  We will attempt to reach you two times.  During this call, we will ask if you have developed any symptoms of COVID 19. If you develop any symptoms (ie: fever, flu-like symptoms, shortness of breath, cough etc.) before then, please call [phone].  If you test positive for Covid 19 in the 2 weeks post procedure, please call and report this information to [place].    If any biopsies were taken you will be contacted by phone or by letter within the next 1-3 weeks.  Please call us at [phone] if you have not heard about the biopsies in 3 weeks.    SIGNATURES/CONFIDENTIALITY: You and/or your care partner have signed paperwork which will be entered into your electronic medical record.  These signatures attest to the fact that that the information above on your After Visit Summary has been reviewed and is understood.  Full responsibility of the confidentiality of this discharge information  lies with you and/or your care-partner.

## 2020-10-02 ENCOUNTER — Telehealth: Payer: Self-pay

## 2020-10-02 NOTE — Telephone Encounter (Signed)
  Follow up Call-  Call back number 09/30/2020  Post procedure Call Back phone  # [phone]  Permission to leave phone message Yes  Some recent data might be hidden     Patient questions:  Do you have a fever, pain , or abdominal swelling? No. Pain Score  0 *  Have you tolerated food without any problems? Yes.    Have you been able to return to your normal activities? Yes.    Do you have any questions about your discharge instructions: Diet   No. Medications  No. Follow up visit  No.  Do you have questions or concerns about your Care? No.  Actions: * If pain score is 4 or above: No action needed, pain <4.  1. Have you developed a fever since your procedure? no  2.   Have you had an respiratory symptoms (SOB or cough) since your procedure? no  3.   Have you tested positive for COVID 19 since your procedure no  4.   Have you had any family members/close contacts diagnosed with the COVID 19 since your procedure?  no   If yes to any of these questions please route to Joylene John, RN and Joella Prince, RN

## 2020-10-06 ENCOUNTER — Encounter: Payer: Self-pay | Admitting: Gastroenterology

## 2020-10-24 ENCOUNTER — Ambulatory Visit: Payer: Medicare Other | Attending: Internal Medicine

## 2020-10-24 DIAGNOSIS — Z23 Encounter for immunization: Secondary | ICD-10-CM

## 2020-10-24 NOTE — Progress Notes (Signed)
   Covid-19 Vaccination Clinic  Name:  Veronica Sutton    MRN: [id number] DOB: 1956-06-12  10/24/2020  Ms. Padia was observed post Covid-19 immunization for 15 minutes without incident. She was provided with Vaccine Information Sheet and instruction to access the V-Safe system.   Ms. Melka was instructed to call 911 with any severe reactions post vaccine: Marland Kitchen Difficulty breathing  . Swelling of face and throat  . A fast heartbeat  . A bad rash all over body  . Dizziness and weakness   Immunizations Administered    Name Date Dose VIS Date Route   Pfizer COVID-19 Vaccine 10/24/2020  2:46 PM 0.3 mL 09/10/2020 Intramuscular   Manufacturer: [place]   Lot: [id number]   NDC: [address]-1

## 2021-02-17 ENCOUNTER — Ambulatory Visit: Payer: Medicare Other | Attending: Internal Medicine

## 2021-02-17 DIAGNOSIS — Z20822 Contact with and (suspected) exposure to covid-19: Secondary | ICD-10-CM

## 2021-02-18 LAB — SARS-COV-2, NAA 2 DAY TAT

## 2021-02-18 LAB — NOVEL CORONAVIRUS, NAA: SARS-CoV-2, NAA: NOT DETECTED

## 2021-02-18 IMAGING — DX DG LUMBAR SPINE 2-3V
3 series · 3 of 3 positions shown · non-contrast
Comparison: None.

CLINICAL DATA: Lumbar spine pain. Chronic right-sided posterior hip
pain extending to the right knee.

EXAM:
LUMBAR SPINE - 2-3 VIEW

[l-spine ap]
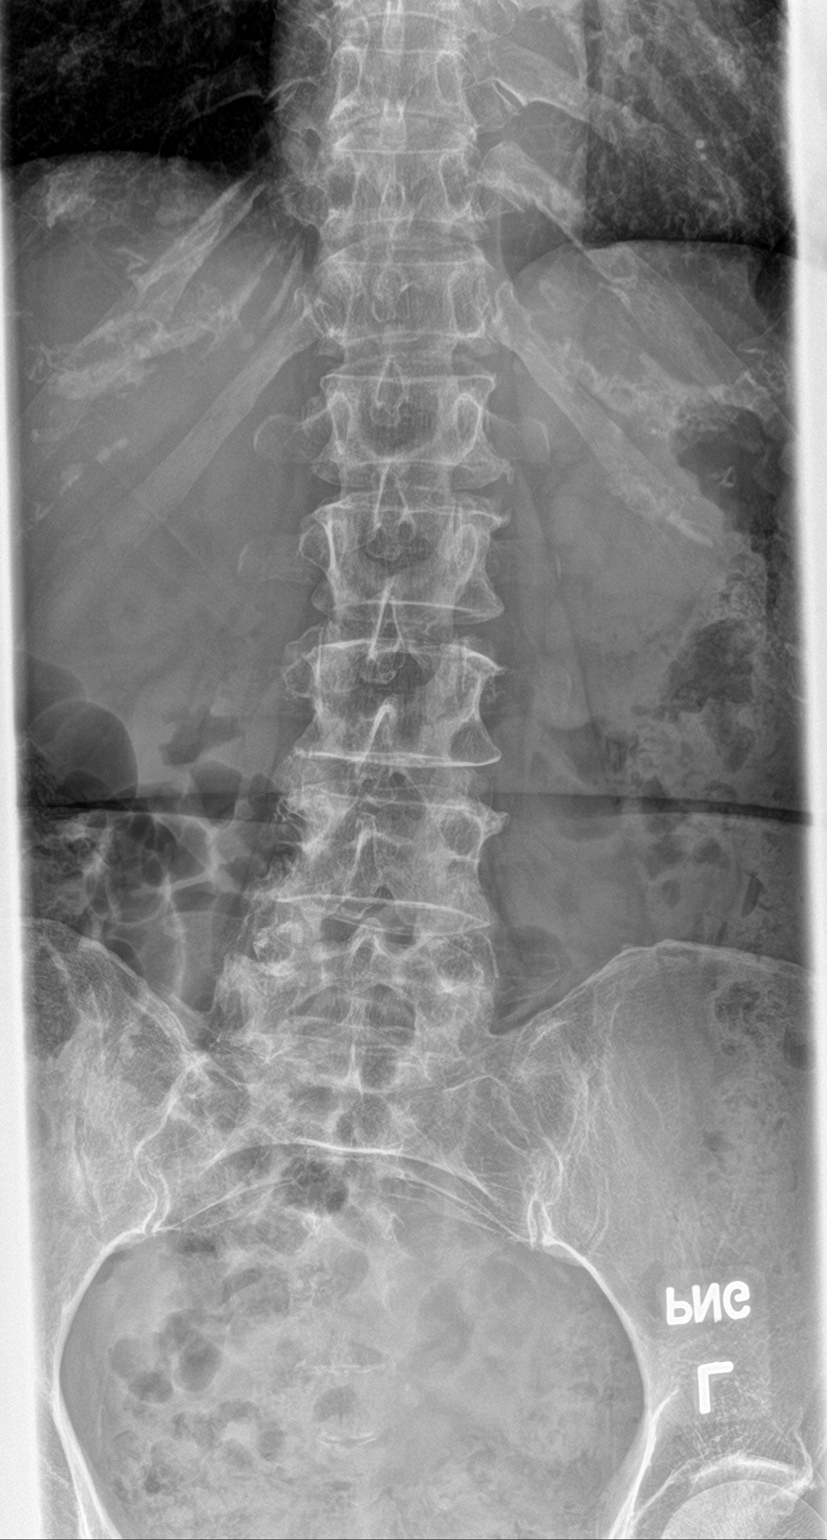

[l-spine lat]
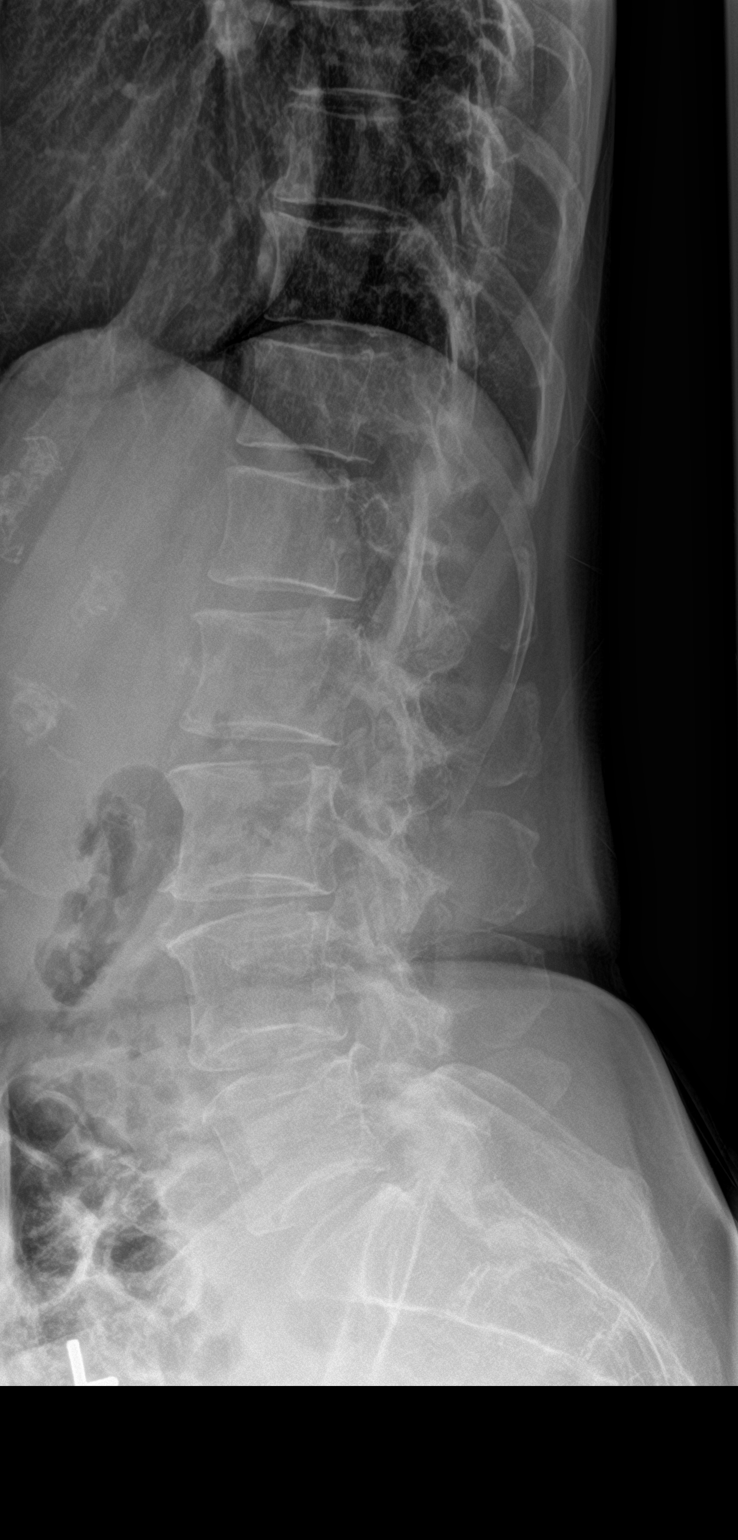

[l-spine spot]
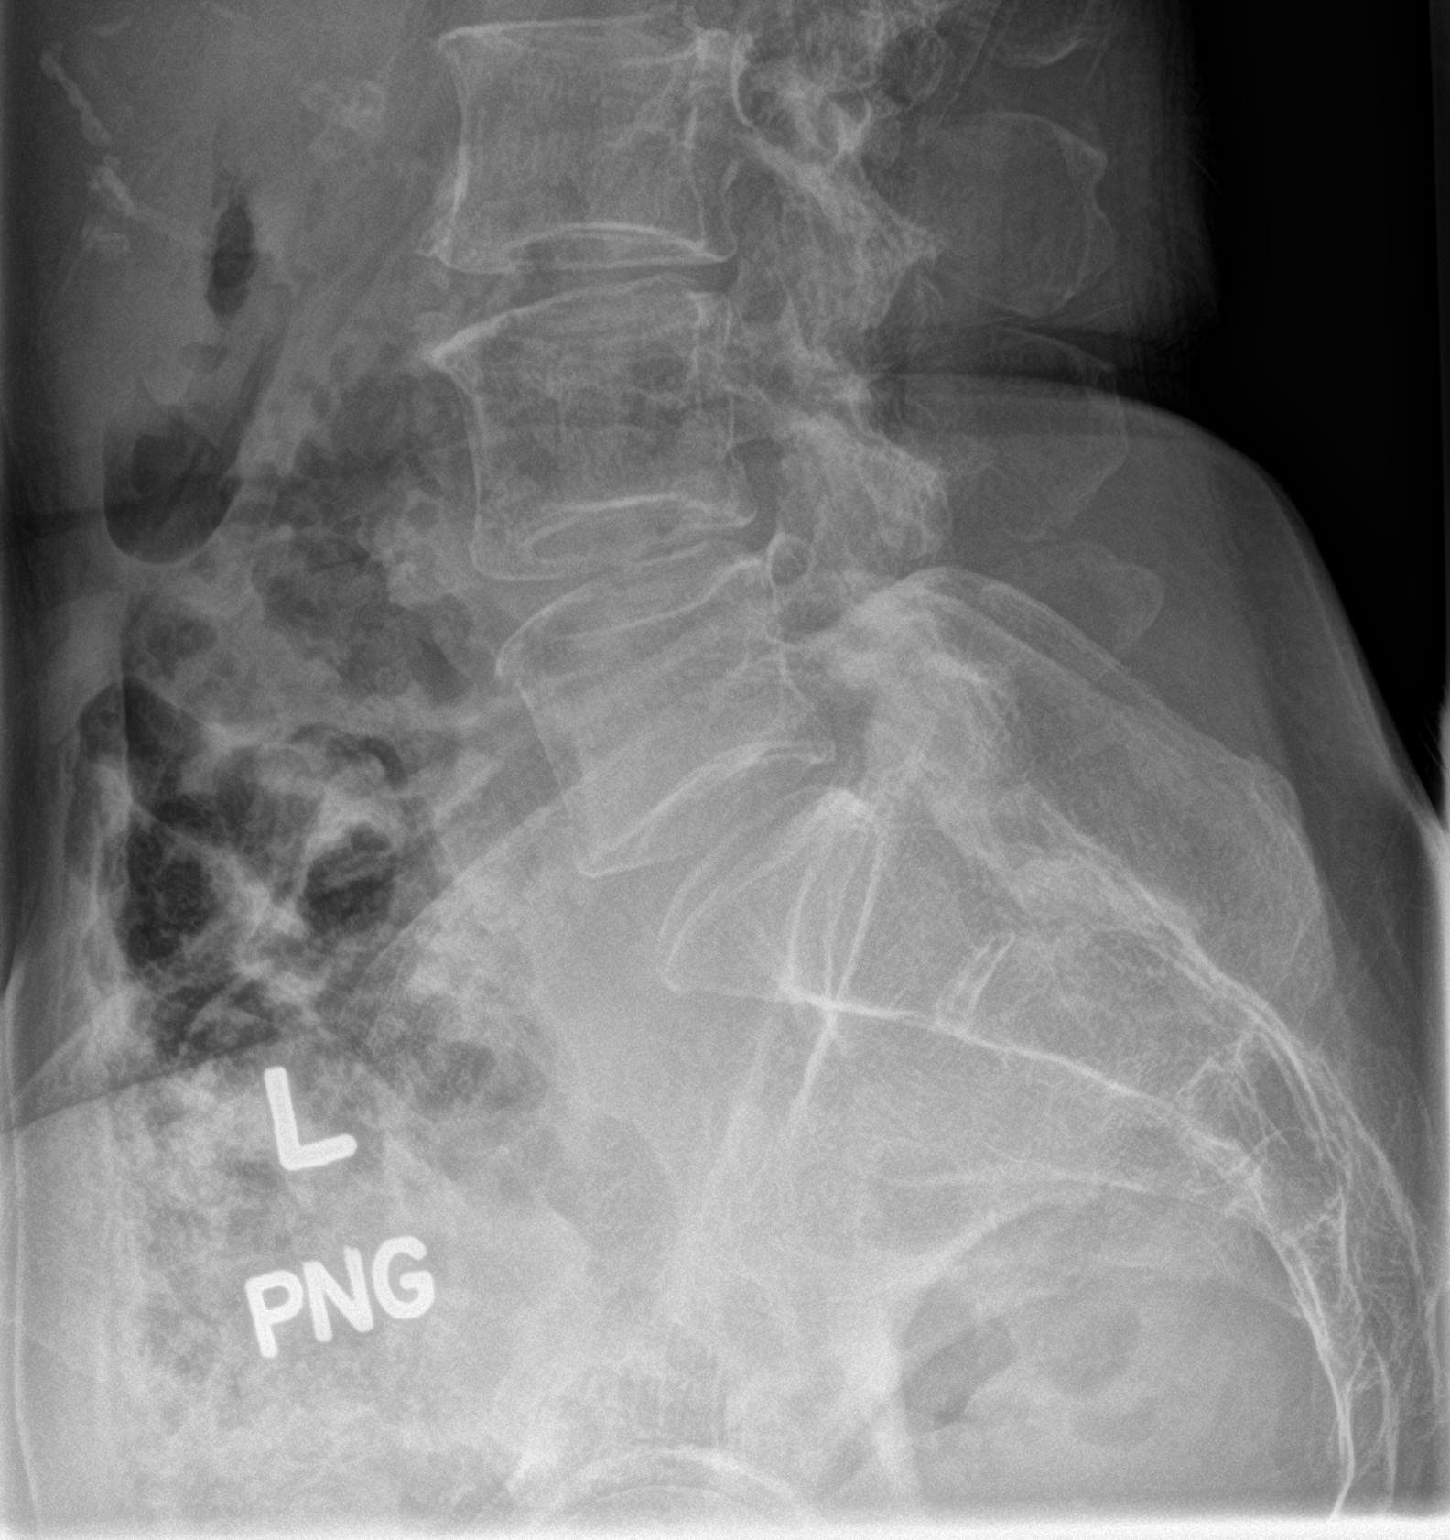

[3 of 3 positions shown; findings below may reference images not displayed]

FINDINGS: There is no lumbar spine fracture or bone destruction. Lateral
alignment is normal. Minimal rotoscoliosis. No appreciable facet
arthritis. Disc spaces are well preserved.
IMPRESSION: No significant abnormalities of the lumbar spine.

## 2021-08-18 ENCOUNTER — Ambulatory Visit: Payer: Medicare Other

## 2021-08-19 ENCOUNTER — Ambulatory Visit: Payer: Medicare Other | Attending: Internal Medicine

## 2021-08-19 DIAGNOSIS — Z23 Encounter for immunization: Secondary | ICD-10-CM

## 2021-08-19 NOTE — Progress Notes (Signed)
   Covid-19 Vaccination Clinic  Name:  Veronica Sutton    MRN: [id number] DOB: [birth year]  08/19/2021  Veronica Sutton was observed post Covid-19 immunization for 15 minutes without incident. She was provided with Vaccine Information Sheet and instruction to access the V-Safe system.   Veronica Sutton was instructed to call 911 with any severe reactions post vaccine: Difficulty breathing  Swelling of face and throat  A fast heartbeat  A bad rash all over body  Dizziness and weakness

## 2021-08-31 ENCOUNTER — Other Ambulatory Visit (HOSPITAL_BASED_OUTPATIENT_CLINIC_OR_DEPARTMENT_OTHER): Payer: Self-pay

## 2021-08-31 MED ORDER — COVID-19MRNA BIVAL VACC PFIZER 30 MCG/0.3ML IM SUSP
INTRAMUSCULAR | 0 refills | Status: AC
  Filled 2021-08-31: qty 0.3, 1d supply, fill #0

## 2022-03-01 ENCOUNTER — Ambulatory Visit (INDEPENDENT_AMBULATORY_CARE_PROVIDER_SITE_OTHER): Payer: Medicare Other | Admitting: Sports Medicine

## 2022-03-01 DIAGNOSIS — M25551 Pain in right hip: Secondary | ICD-10-CM

## 2022-03-01 NOTE — Progress Notes (Signed)
Patient was instructed in 10 minutes of therapeutic exercises for right hip pain to improve strength, ROM and function according to my instructions and plan of care by a Certified Athletic Trainer during the office visit.  Proper technique shown and discussed, handout provided.  All questions discussed and answered.  ?

## 2022-03-02 NOTE — Progress Notes (Addendum)
? ?  Subjective:  ? ? Patient ID: Veronica Sutton, female    DOB: Apr 04, 1956, 66 y.o.   MRN: [id number] ? ?HPI chief complaint: Right hip pain ? ?Veronica Sutton is a very pleasant 66 year old female that comes in today at the request of Dr. Rhona Raider for evaluation of chronic right hip pain.  She initially began to notice symptoms in 2020.  She used to enjoy running and is found that running will exacerbate her symptoms.  She describes a "stinging" pain that she localizes to the right buttock with radiating discomfort into the proximal hamstring and lateral thigh.  Pain will occasionally radiate down to the knee but she denies pain past that point.  No associated numbness or tingling.  Her pain is also exacerbated by sitting.  An MRI of her lumbar spine done recently showed mild to moderate bilateral lateral recess stenosis potentially affecting the L5 nerve root.  Also some facet changes seen.  She has minimal pain with walking.  In fact, she is able to walk for 2 to 3 miles quite comfortably.  She denies groin pain.  She has been working with a physical therapist over the past several weeks but has not noticed any improvement with this.  She would like to return to some limited running if possible. ? ?Past medical history reviewed ?Medications reviewed ?Allergies reviewed ? ? ? ?Review of Systems ?As above ?   ?Objective:  ? Physical Exam ? ?Well-developed, well-nourished.  No acute distress.  Awake alert and oriented x3.  Vital signs reviewed ? ?Right hip: Smooth painless hip range of motion with a negative logroll.  Minimal tenderness to palpation just posterior to the greater trochanter.  4/5 strength with resisted hip abduction when compared to the left.  Positive Trendelenburg. ? ? ?Lumbar spine: Full lumbar range of motion.  No pain with flexion or extension.  No tenderness to palpation. ? ?Neurological exam: Strength is 5/5 in both lower extremities.  Reflexes are brisk and equal at the patellar and Achilles reflexes  bilaterally.  No noticeable atrophy. ? ?Gait analysis shows a neutral foot strike when walking and running.  She does have dynamic genu valgus bilaterally.  No obvious limp. ? ? ?   ?Assessment & Plan:  ? ?Chronic posterior right hip pain possibly secondary to global hip weakness ? ?Although her MRI of her lumbar spine does show some mild degenerative changes, clinically I do not believe she fits this picture.  We will give her some home exercises including exercises to strengthen her hip abductors, pelvic stabilizers, and hamstrings (Askling exercises).  I encouraged her to start a easy walk run program.  She currently walks about 3 miles at a time so she will simply start introducing some light running.  Follow-up with me again in 6 weeks.  If her pain worsens with running, she will stop.  Also, if she reports symptoms more suggestive of right leg radiculopathy with running then I may consider a diagnostic lumbar ESI. ?

## 2022-04-13 ENCOUNTER — Ambulatory Visit: Payer: Medicare Other | Admitting: Sports Medicine

## 2022-04-20 ENCOUNTER — Other Ambulatory Visit: Payer: Self-pay | Admitting: Interventional Radiology

## 2022-04-20 DIAGNOSIS — I872 Venous insufficiency (chronic) (peripheral): Secondary | ICD-10-CM

## 2022-05-03 ENCOUNTER — Ambulatory Visit
Admission: RE | Admit: 2022-05-03 | Discharge: 2022-05-03 | Disposition: A | Discharge status: Home / Self Care | Payer: Medicare Other | Source: Ambulatory Visit | Attending: Interventional Radiology | Admitting: Interventional Radiology

## 2022-05-03 DIAGNOSIS — I872 Venous insufficiency (chronic) (peripheral): Secondary | ICD-10-CM

## 2022-05-03 NOTE — Progress Notes (Signed)
Patient ID: Veronica Sutton, female   DOB: 1955/12/29, 66 y.o.   MRN: [id number]       Chief Complaint:  Recurrent lower extremity pain, history of minor venous deficiency, spider veins, and small calf varicose veins.  Referring Physician(s): Eydie Wormley  History of Present Illness: Veronica Sutton is a 66 y.o. female with a remote history of lower extremity leg pain and small varicose veins.  She was evaluated for venous insufficiency in March 2018.  This demonstrated only minor right distal GSV venous insufficiency with some early small right calf branching varicosities.  These were initially treated conservatively with compression stockings, and daily exercise.  She subsequently underwent right calf varicose vein foam sclerotherapy as well as bilateral popliteal region spider veins sclerotherapy in 2018.  She has a remote history of prior localized right varicose vein phlebectomy in the medial right knee over a short segment.  She returns today because of some recurrent bilateral calf and popliteal symptoms.  She describes minor achy pain and stinging sensation in the calf and popliteal regions in the areas of prior treatment.  No recent thrombophlebitis or cellulitis.  No skin lesions or ulcerations.  No chronic venous stasis skin changes.  She remains very active and walks daily.  No physical limitations.  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  She currently does not take any pain medications.  She has not been wearing the knee-high compression stockings.   Past Medical History:  Diagnosis Date   Anxiety    Esophagitis    GERD (gastroesophageal reflux disease)    Migraine    Pneumonia    Vitamin D deficiency     Past Surgical History:  Procedure Laterality Date   BREAST BIOPSY  1991   right    Allergies: Patient has no known allergies.  Medications: Prior to Admission medications   Medication Sig Start Date End Date Taking? Authorizing Provider  Cholecalciferol  (VITAMIN D) 1000 UNITS capsule Take 1,000 Units by mouth daily.      [provider]  COVID-19 mRNA bivalent vaccine, Pfizer, injection Inject into the muscle. 08/19/21   Carlyle Basques, MD  estradiol (VIVELLE-DOT) 0.025 MG/24HR Place 1 patch onto the skin 2 (two) times a week.    [provider]  famotidine (PEPCID) 20 MG tablet Take 1 tablet (20 mg total) by mouth 2 (two) times daily. Patient not taking: Reported on 09/30/2020 07/30/20   Mauri Pole, MD  progesterone (PROMETRIUM) 100 MG capsule Take 100 mg by mouth daily.    [provider]  vitamin B-12 (CYANOCOBALAMIN) 500 MCG tablet Take 500 mcg by mouth daily.    [provider]     Family History  Problem Relation Age of Onset   Pulmonary fibrosis Father        idiopathic   Colon polyps Father    Depression Mother    Heart disease Paternal Grandfather    Diabetes Maternal Grandfather        questionable   Depression Brother    Diabetes Sister    Colon cancer Neg Hx    Esophageal cancer Neg Hx    Stomach cancer Neg Hx    Rectal cancer Neg Hx     Social History   Socioeconomic History   Marital status: Married    Spouse name: Not on file   Number of children: Not on file   Years of education: Not on file   Highest education level: Not on file  Occupational  History   Not on file  Tobacco Use   Smoking status: Never   Smokeless tobacco: Never  Vaping Use   Vaping Use: Never used  Substance and Sexual Activity   Alcohol use: No    Comment: < 1 drink daily   Drug use: No   Sexual activity: Not on file  Other Topics Concern   Not on file  Social History Narrative   Not on file   Social Determinants of Health   Financial Resource Strain: Not on file  Food Insecurity: Not on file  Transportation Needs: Not on file  Physical Activity: Not on file  Stress: Not on file  Social Connections: Not on file    Review of Systems: A 12 point ROS discussed and pertinent positives  are indicated in the HPI above.  All other systems are negative.  Review of Systems  Vital Signs: There were no vitals taken for this visit.  Physical Exam Constitutional:      General: She is not in acute distress.    Appearance: She is normal weight. She is not diaphoretic.  Eyes:     General: No scleral icterus.    Conjunctiva/sclera: Conjunctivae normal.  Musculoskeletal:        General: No swelling or tenderness. Normal range of motion.     Right lower leg: Edema present.     Left lower leg: No edema.     Comments: No significant palpable or visible varicose veins in either lower extremity.  Well-healed scar in the proximal medial right calf area from remote surgical phlebectomy.  Very minor scattered spider veins.  Skin:    General: Skin is warm and dry.     Findings: No bruising, erythema or rash.  Neurological:     General: No focal deficit present.     Mental Status: Mental status is at baseline.  Psychiatric:        Mood and Affect: Mood normal.        Thought Content: Thought content normal.        Imaging: US Venous Img Lower Bilateral (DVT)  Result Date: 05/03/2022 CLINICAL DATA:  Calf and popliteal region lower extremity pain EXAM: BILATERAL LOWER EXTREMITY VENOUS DOPPLER ULTRASOUND TECHNIQUE: Gray-scale sonography with graded compression, as well as color Doppler and duplex ultrasound were performed to evaluate the lower extremity deep venous systems from the level of the common femoral vein and including the common femoral, femoral, profunda femoral, popliteal and calf veins including the posterior tibial, peroneal and gastrocnemius veins when visible. The superficial great saphenous vein was also interrogated. Spectral Doppler was utilized to evaluate flow at rest and with distal augmentation maneuvers in the common femoral, femoral and popliteal veins. COMPARISON:  None Available. FINDINGS: RIGHT LOWER EXTREMITY Common Femoral Vein: No evidence of thrombus.  Normal compressibility, respiratory phasicity and response to augmentation. Saphenofemoral Junction: No evidence of thrombus. Normal compressibility and flow on color Doppler imaging. Negative for venous insufficiency or reflux Profunda Femoral Vein: No evidence of thrombus. Normal compressibility and flow on color Doppler imaging. Femoral Vein: No evidence of thrombus. Normal compressibility, respiratory phasicity and response to augmentation. Popliteal Vein: No evidence of thrombus. Normal compressibility, respiratory phasicity and response to augmentation. Calf Veins: No evidence of thrombus. Normal compressibility and flow on color Doppler imaging. Superficial Great Saphenous Vein: Minor infrapopliteal calf region trace venous insufficiency. Small saphenous vein: No evidence of thrombus. Normal compressibility. Negative for reflux. Venous Reflux:  As above Other Findings:  No significant sub  surface varicosities LEFT LOWER EXTREMITY Common Femoral Vein: No evidence of thrombus. Normal compressibility, respiratory phasicity and response to augmentation. Saphenofemoral Junction: No evidence of thrombus. Normal compressibility and flow on color Doppler imaging. No significant venous insufficiency or reflux Profunda Femoral Vein: No evidence of thrombus. Normal compressibility and flow on color Doppler imaging. Femoral Vein: No evidence of thrombus. Normal compressibility, respiratory phasicity and response to augmentation. Popliteal Vein: No evidence of thrombus. Normal compressibility, respiratory phasicity and response to augmentation. Calf Veins: No evidence of thrombus. Normal compressibility and flow on color Doppler imaging. Superficial Great Saphenous Vein: Very minor thigh and knee region GSV venous insufficiency without significant dilatation or tortuosity. No branching varicosities. Small saphenous vein: No evidence of thrombus. Normal compressibility. Negative for reflux. Venous Reflux:  As above Other  Findings:  No significant sub surface varicosities IMPRESSION: Negative for DVT in either lower extremity. Areas of very minor GSV venous insufficiency bilaterally as above without significant sub surface branching varicosities. Electronically Signed   By: Jerilynn Mages.  Emanuel Campos M.D.   On: 05/03/2022 13:26   [place] RAD EVAL AND MGMT  Result Date: 05/03/2022 Please refer to "Notes" to see consult details.   Labs:  CBC: No results for input(s): "WBC", "HGB", "HCT", "PLT" in the last 8760 hours.  COAGS: No results for input(s): "INR", "APTT" in the last 8760 hours.  BMP: No results for input(s): "NA", "K", "CL", "CO2", "GLUCOSE", "BUN", "CALCIUM", "CREATININE", "GFRNONAA", "GFRAA" in the last 8760 hours.  Invalid input(s): "CMP"  LIVER FUNCTION TESTS: No results for input(s): "BILITOT", "AST", "ALT", "ALKPHOS", "PROT", "ALBUMIN" in the last 8760 hours.  Assessment and Plan:  Very minor areas of short segment GSV venous insufficiency bilaterally without visible subsurface varicosities.  No significant visualized varicose vein that warrants foam sclerotherapy today.  GSV minor venous insufficiency does not warrant transcatheter laser occlusion.  Plan: Recommend continue conservative management with 20/30 mmHg knee-high compression stockings and daily walking regimen.  She seems encouraged and agrees with this plan.  Follow-up as needed for any developing symptomatic varicosities.  Thank you for this interesting consult.  I greatly enjoyed meeting Veronica Sutton and look forward to participating in their care.  A copy of this report was sent to the requesting provider on this date.  Electronically Signed: Greggory Keen 05/03/2022, 1:37 PM   I spent a total of    40 Minutes in face to face in clinical consultation, greater than 50% of which was counseling/coordinating care for this patient with mild venous sufficiency.

## 2024-05-30 IMAGING — US US EXTREM LOW VENOUS
1 series · 12 of 24 positions shown · non-contrast
Comparison: None Available.

CLINICAL DATA: Calf and popliteal region lower extremity pain



[Series 1: us extrem low venous · 0.07mm/px · 12 of 57 slices shown]
[im 3/57]
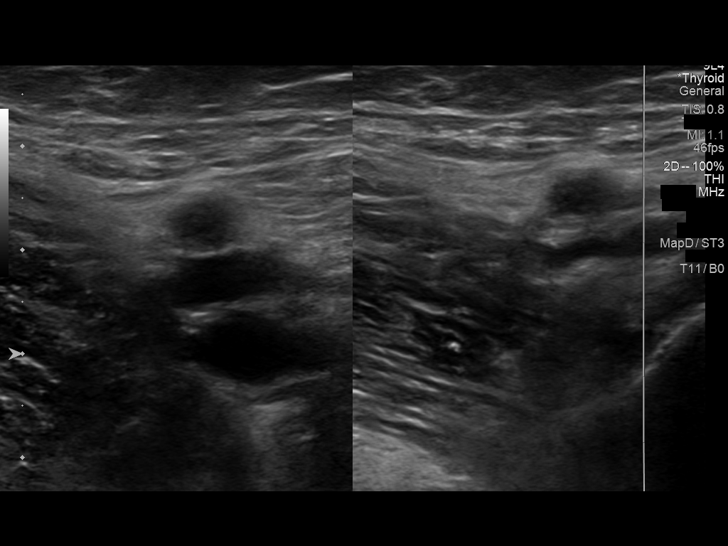
[im 8/57]
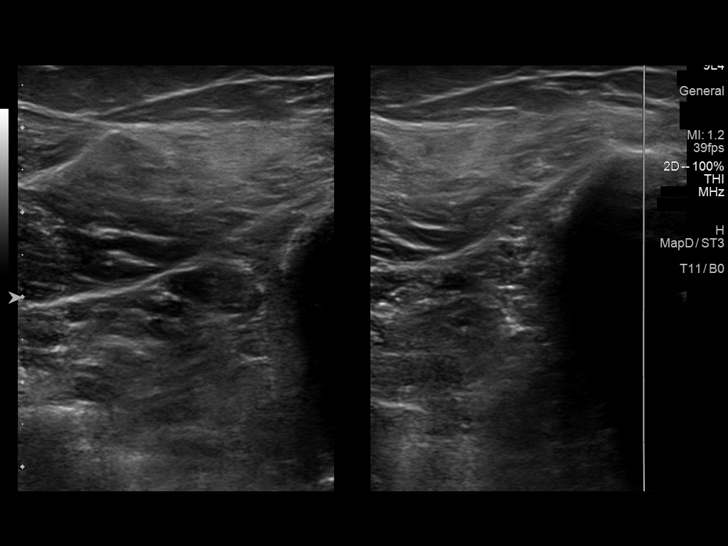
[im 13/57]
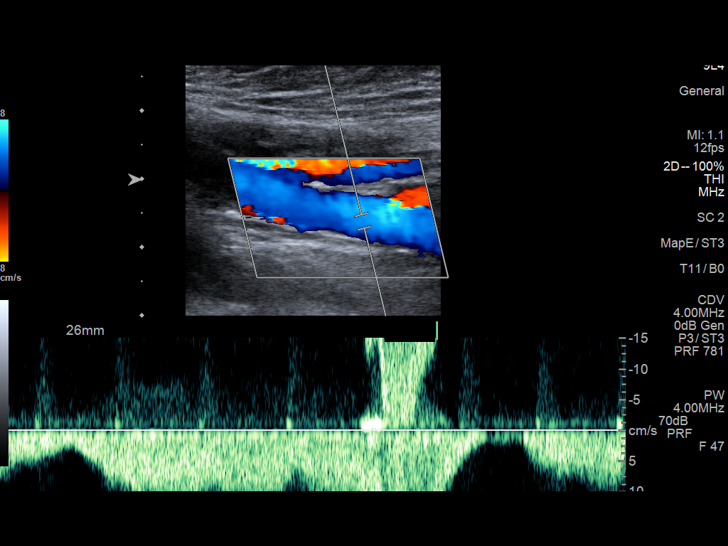
[im 18/57]
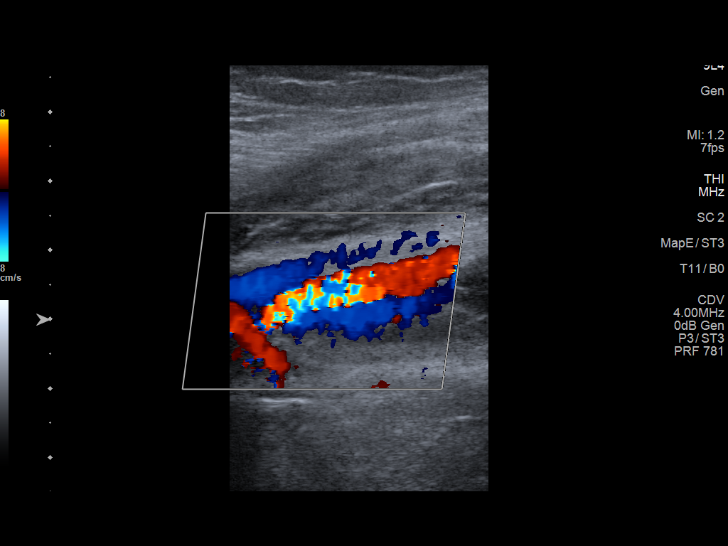
[im 22/57]
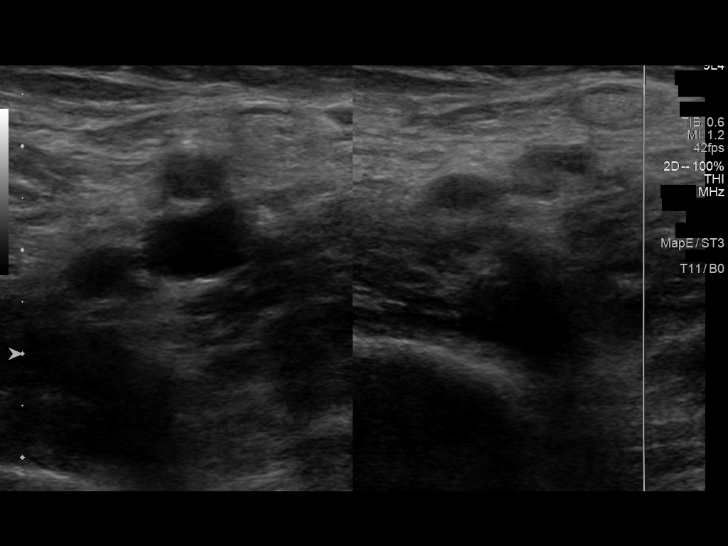
[im 27/57]
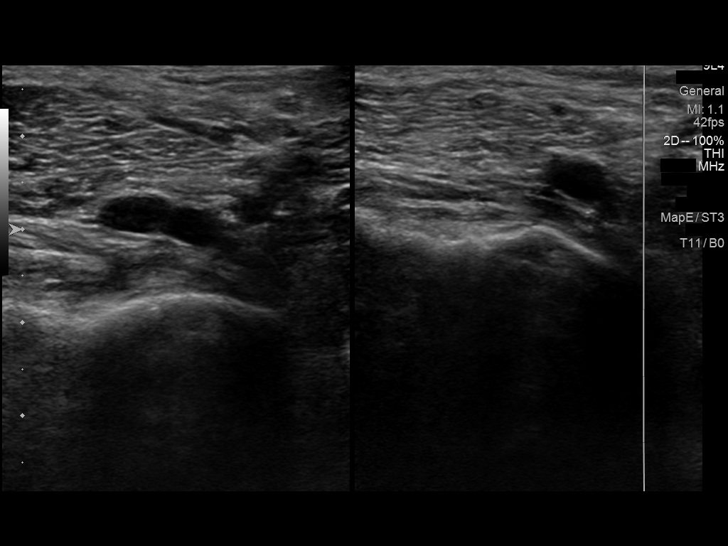
[im 32/57]
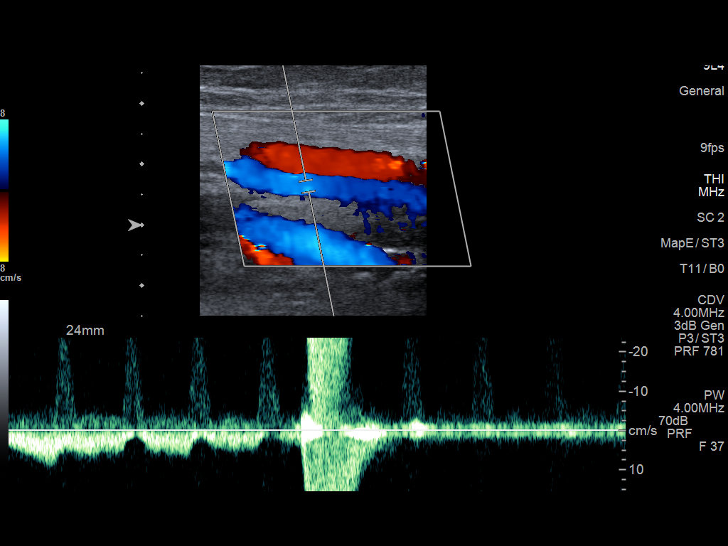
[im 37/57]
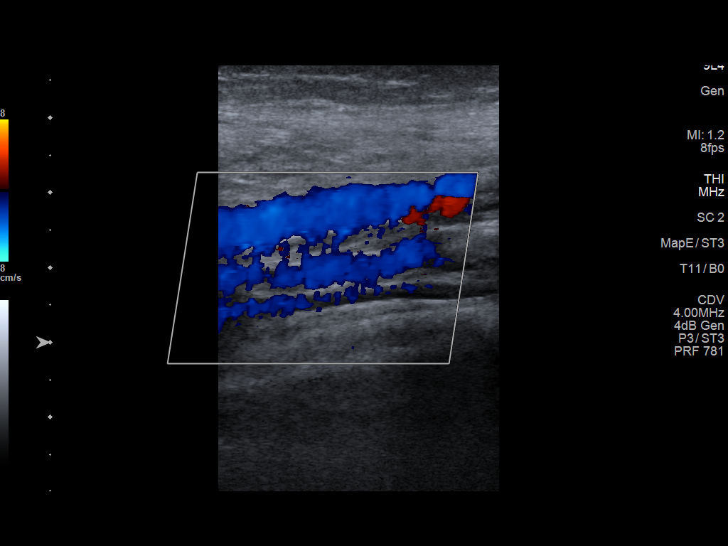
[im 42/57]
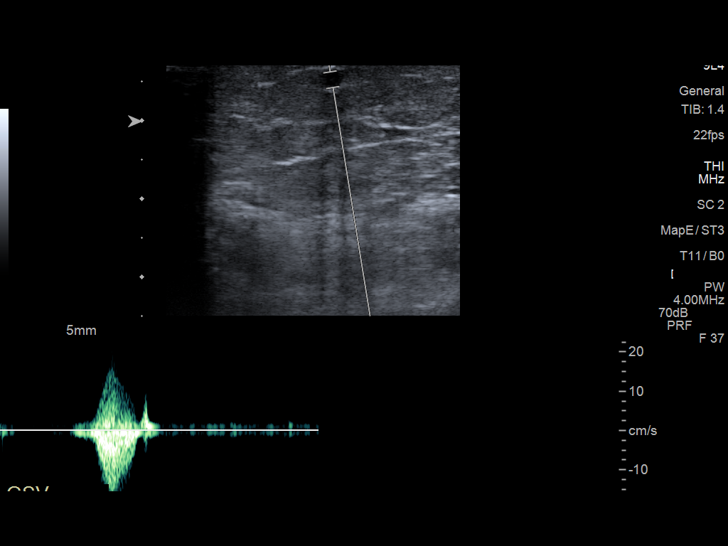
[im 47/57]
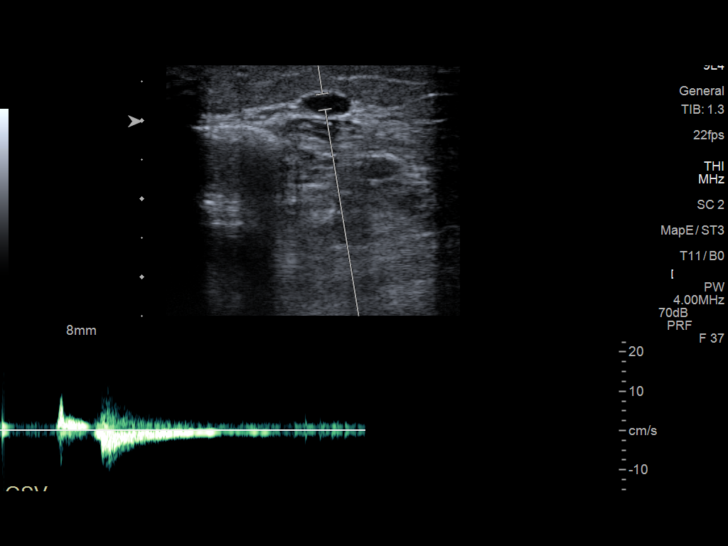
[im 52/57]
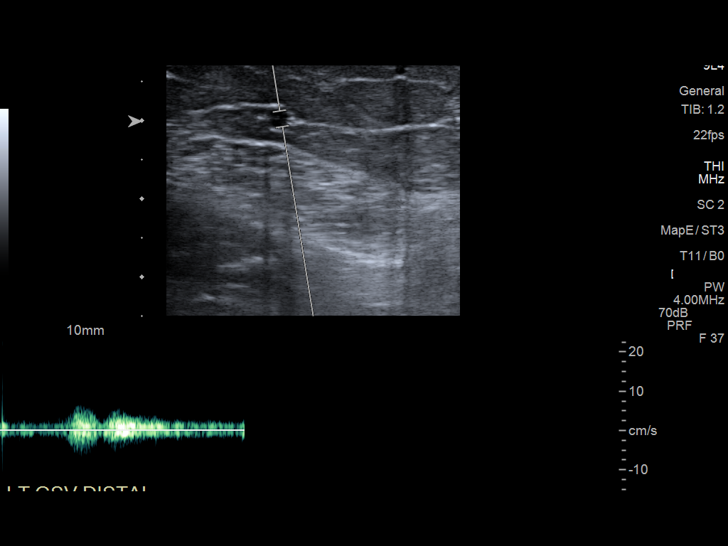
[im 57/57]
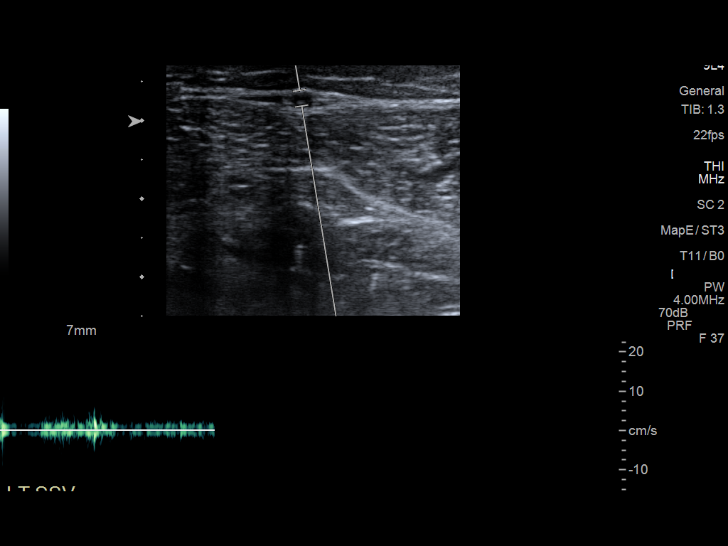

[12 of 24 positions shown; findings below may reference images not displayed]

FINDINGS: RIGHT LOWER EXTREMITY

Common Femoral Vein: No evidence of thrombus. Normal
compressibility, respiratory phasicity and response to augmentation.

Saphenofemoral Junction: No evidence of thrombus. Normal
compressibility and flow on color Doppler imaging. Negative for
venous insufficiency or reflux

Profunda Femoral Vein: No evidence of thrombus. Normal
compressibility and flow on color Doppler imaging.

Femoral Vein: No evidence of thrombus. Normal compressibility,
respiratory phasicity and response to augmentation.

Popliteal Vein: No evidence of thrombus. Normal compressibility,
respiratory phasicity and response to augmentation.

Calf Veins: No evidence of thrombus. Normal compressibility and flow
on color Doppler imaging.

Superficial Great Saphenous Vein: Minor infrapopliteal calf region
trace venous insufficiency.

Small saphenous vein: No evidence of thrombus. Normal
compressibility. Negative for reflux.

Venous Reflux:  As above

Other Findings:  No significant sub surface varicosities

LEFT LOWER EXTREMITY

Common Femoral Vein: No evidence of thrombus. Normal
compressibility, respiratory phasicity and response to augmentation.

Saphenofemoral Junction: No evidence of thrombus. Normal
compressibility and flow on color Doppler imaging. No significant
venous insufficiency or reflux

Profunda Femoral Vein: No evidence of thrombus. Normal
compressibility and flow on color Doppler imaging.

Femoral Vein: No evidence of thrombus. Normal compressibility,
respiratory phasicity and response to augmentation.

Popliteal Vein: No evidence of thrombus. Normal compressibility,
respiratory phasicity and response to augmentation.

Calf Veins: No evidence of thrombus. Normal compressibility and flow
on color Doppler imaging.

Superficial Great Saphenous Vein: Very minor thigh and knee region
GSV venous insufficiency without significant dilatation or
tortuosity. No branching varicosities.

Small saphenous vein: No evidence of thrombus. Normal
compressibility. Negative for reflux.

Venous Reflux:  As above

Other Findings:  No significant sub surface varicosities
IMPRESSION: Negative for DVT in either lower extremity.

Areas of very minor GSV venous insufficiency bilaterally as above
without significant sub surface branching varicosities.

## 2024-08-30 NOTE — Progress Notes (Unsigned)
 Chief Complaint: Rectal bleeding  HPI:    [name] is a 68 year old female known to Dr. Shila, with a past medical history as listed below including anxiety and reflux with esophagitis, who was referred to me by Blackford, Devere SQUIBB, MD for a complaint of rectal bleeding.      05/16/2009 EGD was normal.  Patient given Alprazolam 0.25 mg as needed for esophageal spasm.    09/30/20 colonoscopy with 1 less than 1 mm polyp in the descending colon, two 4-6 mm polyps in the sigmoid colon and descending colon and nonbleeding internal hemorrhoids.  Pathology showed tubular adenoma and repeat recommended in 7 years.  No family history of colon cancer.       Today, patient presents to clinic and explains that she saw some bright red blood on the outside of her stool earlier this week that lasted for a day and a half with every bowel movement.  She has seen them over the past 3 to 4 days.  Describes that she was straining a bit to have a bowel movement.  Has a history of hemorrhoids and thought this was what it was but has been a while since her last colonoscopy so she wanted to make sure that she did not need another.  Denies any other GI complaints or concerns.    Denies fever, chills or weight loss.  Past Medical History:  Diagnosis Date   Anxiety    Esophagitis    GERD (gastroesophageal reflux disease)    Migraine    Pneumonia    Vitamin D deficiency     Past Surgical History:  Procedure Laterality Date   BREAST BIOPSY  1991   right    Current Outpatient Medications  Medication Sig Dispense Refill   Cholecalciferol (VITAMIN D) 1000 UNITS capsule Take 1,000 Units by mouth daily.       COVID-19 mRNA bivalent vaccine, Pfizer, injection Inject into the muscle. 0.3 mL 0   estradiol (VIVELLE-DOT) 0.025 MG/24HR Place 1 patch onto the skin 2 (two) times a week.     famotidine  (PEPCID ) 20 MG tablet Take 1 tablet (20 mg total) by mouth 2 (two) times daily. (Patient not taking: Reported on  09/30/2020) 60 tablet 3   progesterone (PROMETRIUM) 100 MG capsule Take 100 mg by mouth daily.     vitamin B-12 (CYANOCOBALAMIN) 500 MCG tablet Take 500 mcg by mouth daily.     No current facility-administered medications for this visit.    Allergies as of 08/31/2024   (No Known Allergies)    Family History  Problem Relation Age of Onset   Pulmonary fibrosis Father        idiopathic   Colon polyps Father    Depression Mother    Heart disease Paternal Grandfather    Diabetes Maternal Grandfather        questionable   Depression Brother    Diabetes Sister    Colon cancer Neg Hx    Esophageal cancer Neg Hx    Stomach cancer Neg Hx    Rectal cancer Neg Hx     Social History   Socioeconomic History   Marital status: Married    Spouse name: Not on file   Number of children: Not on file   Years of education: Not on file   Highest education level: Not on file  Occupational History   Not on file  Tobacco Use   Smoking status: Never   Smokeless tobacco: Never  Vaping Use  Vaping status: Never Used  Substance and Sexual Activity   Alcohol use: No    Comment: < 1 drink daily   Drug use: No   Sexual activity: Not on file  Other Topics Concern   Not on file  Social History Narrative   Not on file   Social Drivers of Health   Financial Resource Strain: Not on file  Food Insecurity: Not on file  Transportation Needs: Not on file  Physical Activity: Not on file  Stress: Not on file  Social Connections: Not on file  Intimate Partner Violence: Not on file    Review of Systems:    Constitutional: No weight loss, fever or chills Skin: No rash or itching Cardiovascular: No chest pain, chest pressure or palpitations   Respiratory: No SOB or cough Gastrointestinal: See HPI and otherwise negative Genitourinary: No dysuria or change in urinary frequency Neurological: No headache, dizziness or syncope Musculoskeletal: No new muscle or joint pain Hematologic: No  bruising Psychiatric: No history of depression or anxiety   Physical Exam:  Vital signs: BP (!) 140/70   Pulse 88   Ht 5' 4.25 (1.632 m)   Wt 100 lb 9.6 oz (45.6 kg)   BMI 17.13 kg/m    Constitutional:   Pleasant Caucasian female appears to be in NAD, Well developed, Well nourished, alert and cooperative Head:  Normocephalic and atraumatic. Eyes:   PEERL, EOMI. No icterus. Conjunctiva pink. Ears:  Normal auditory acuity. Neck:  Supple Throat: Oral cavity and pharynx without inflammation, swelling or lesion.  Respiratory: Respirations even and unlabored. Lungs clear to auscultation bilaterally.   No wheezes, crackles, or rhonchi.  Cardiovascular: Normal S1, S2. No MRG. Regular rate and rhythm. No peripheral edema, cyanosis or pallor.  Gastrointestinal:  Soft, nondistended, nontender. No rebound or guarding. Normal bowel sounds. No appreciable masses or hepatomegaly. Rectal: External: Posterior hemorrhoid tag, minimally decreased sphincter tone; internal: No tenderness, no fissure; anoscopy: Grade 1 hemorrhoids and 2 columns Msk:  Symmetrical without gross deformities. Without edema, no deformity or joint abnormality.  Neurologic:  Alert and  oriented x4;  grossly normal neurologically.  Skin:   Dry and intact without significant lesions or rashes. Psychiatric: Oriented to person, place and time. Demonstrates good judgement and reason without abnormal affect or behaviors.  No recent labs or imaging.  Assessment: 1.  Rectal bleeding: This occurred 3 days ago and lasted for a day and a half with each of her bowel movements, fresh red blood on the outside of the stool, grade 1 hemorrhoids on exam today which are likely the cause, last colonoscopy in 2021 with 1 small adenomatous polyp, repeat recommended in 7 years; hemorrhoid bleeding 2.  Grade 1 hemorrhoids: See above  Plan: 1.  Patient has never used Hydrocortisone suppositories in the past.  Prescribed her Hydrocortisone  suppositories today twice daily x 2 to 3 days as it looks like the bleeding is stopped and hemorrhoids are not very bad today.  She can use the others as needed.  Discussed that if she has increased bleeding in the future or the bleeding does not stop with suppositories and I would recommend that we proceed with an earlier colonoscopy.  Prescribe #14 with 1 refill. 2.  Encouraged patient to use a fiber supplement and drink lots of water to keep her stools normal 3.  Patient to follow clinic with us  as needed.  She will contact me through MyChart if she becomes worried about this and then I would recommend a  colonoscopy.  Delon Failing, PA-C  Gastroenterology 08/30/2024, 3:13 PM  Cc: Bridget Devere SQUIBB, MD

## 2024-08-31 ENCOUNTER — Encounter: Payer: Self-pay | Admitting: Physician Assistant

## 2024-08-31 ENCOUNTER — Ambulatory Visit: Admitting: Physician Assistant

## 2024-08-31 VITALS — BP 140/70 | HR 88 | Ht 64.25 in | Wt 100.6 lb

## 2024-08-31 DIAGNOSIS — K640 First degree hemorrhoids: Secondary | ICD-10-CM

## 2024-08-31 DIAGNOSIS — K625 Hemorrhage of anus and rectum: Secondary | ICD-10-CM

## 2024-08-31 DIAGNOSIS — K649 Unspecified hemorrhoids: Secondary | ICD-10-CM

## 2024-08-31 MED ORDER — HYDROCORTISONE ACETATE 25 MG RE SUPP: 25.0000 mg | Freq: Two times a day (BID) | RECTAL | 0 refills | Status: AC

## 2024-08-31 NOTE — Patient Instructions (Addendum)
 We have sent the following medications to your pharmacy for you to pick up at your convenience: Hydrocortisone suppository twice daily 4-7 days.   Call with any recurring symptoms.   _______________________________________________________  If your blood pressure at your visit was 140/90 or greater, please contact your primary care physician to follow up on this.  _______________________________________________________  If you are age 69 or older, your body mass index should be between 23-30. Your Body mass index is 17.13 kg/m. If this is out of the aforementioned range listed, please consider follow up with your Primary Care Provider.  If you are age 2 or younger, your body mass index should be between 19-25. Your Body mass index is 17.13 kg/m. If this is out of the aformentioned range listed, please consider follow up with your Primary Care Provider.   ________________________________________________________  The [facility] providers would like to encourage you to use MYCHART to communicate with providers for non-urgent requests or questions.  Due to long hold times on the telephone, sending your provider a message by [facility] may be a faster and more efficient way to get a response.  Please allow 48 business hours for a response.  Please remember that this is for non-urgent requests.  _______________________________________________________  [facility] is using a team-based approach to care.  Your team is made up of your doctor and two to three APPS. Our APPS (Nurse Practitioners and Physician Assistants) work with your physician to ensure care continuity for you. They are fully qualified to address your health concerns and develop a treatment plan. They communicate directly with your gastroenterologist to care for you. Seeing the Advanced Practice Practitioners on your physician's team can help you by facilitating care more promptly, often allowing for earlier appointments,  access to diagnostic testing, procedures, and other specialty referrals.
